# Patient Record
Sex: Female | Born: 1975 | Race: White | Hispanic: No | Marital: Single | Smoking: Current every day smoker
Health system: Southern US, Community
[De-identification: ages and names within clinical notes are randomized; demographics above are authoritative.]

## PROBLEM LIST (undated history)

## (undated) DIAGNOSIS — K589 Irritable bowel syndrome without diarrhea: Secondary | ICD-10-CM

## (undated) DIAGNOSIS — N3010 Interstitial cystitis (chronic) without hematuria: Secondary | ICD-10-CM

## (undated) DIAGNOSIS — K219 Gastro-esophageal reflux disease without esophagitis: Secondary | ICD-10-CM

## (undated) DIAGNOSIS — Z789 Other specified health status: Secondary | ICD-10-CM

## (undated) DIAGNOSIS — G43909 Migraine, unspecified, not intractable, without status migrainosus: Secondary | ICD-10-CM

## (undated) DIAGNOSIS — M797 Fibromyalgia: Secondary | ICD-10-CM

## (undated) DIAGNOSIS — G5601 Carpal tunnel syndrome, right upper limb: Secondary | ICD-10-CM

## (undated) DIAGNOSIS — E039 Hypothyroidism, unspecified: Secondary | ICD-10-CM

## (undated) HISTORY — DX: Interstitial cystitis (chronic) without hematuria: N30.10

## (undated) HISTORY — DX: Hypothyroidism, unspecified: E03.9

## (undated) HISTORY — PX: CHOLECYSTECTOMY: SHX55

## (undated) HISTORY — DX: Fibromyalgia: M79.7

---

## 2001-10-24 ENCOUNTER — Observation Stay (HOSPITAL_COMMUNITY): Admission: RE | Admit: 2001-10-24 | Discharge: 2001-10-25 | Payer: Self-pay | Admitting: Obstetrics & Gynecology

## 2001-10-24 HISTORY — PX: SALPINGOOPHORECTOMY: SHX82

## 2001-10-24 HISTORY — PX: VAGINAL HYSTERECTOMY: SUR661

## 2002-12-05 ENCOUNTER — Ambulatory Visit (HOSPITAL_COMMUNITY): Admission: RE | Admit: 2002-12-05 | Discharge: 2002-12-05 | Payer: Self-pay | Admitting: Internal Medicine

## 2003-01-22 ENCOUNTER — Ambulatory Visit (HOSPITAL_COMMUNITY): Admission: RE | Admit: 2003-01-22 | Discharge: 2003-01-22 | Payer: Self-pay | Admitting: Obstetrics & Gynecology

## 2003-01-22 HISTORY — PX: LAPAROSCOPIC SALPINGOOPHERECTOMY: SUR795

## 2003-11-03 ENCOUNTER — Emergency Department (HOSPITAL_COMMUNITY): Admission: EM | Admit: 2003-11-03 | Discharge: 2003-11-04 | Payer: Self-pay | Admitting: *Deleted

## 2008-01-20 ENCOUNTER — Emergency Department (HOSPITAL_COMMUNITY): Admission: EM | Admit: 2008-01-20 | Discharge: 2008-01-20 | Payer: Self-pay | Admitting: Emergency Medicine

## 2010-01-04 ENCOUNTER — Ambulatory Visit: Payer: Self-pay | Admitting: Internal Medicine

## 2010-01-11 DIAGNOSIS — K219 Gastro-esophageal reflux disease without esophagitis: Secondary | ICD-10-CM

## 2010-01-11 DIAGNOSIS — R1319 Other dysphagia: Secondary | ICD-10-CM

## 2010-01-13 HISTORY — PX: ESOPHAGOGASTRODUODENOSCOPY: SHX1529

## 2010-01-18 ENCOUNTER — Ambulatory Visit (HOSPITAL_COMMUNITY)
Admission: RE | Admit: 2010-01-18 | Discharge: 2010-01-18 | Payer: Self-pay | Source: Home / Self Care | Admitting: Internal Medicine

## 2010-01-20 ENCOUNTER — Encounter: Payer: Self-pay | Admitting: Internal Medicine

## 2010-01-21 ENCOUNTER — Telehealth (INDEPENDENT_AMBULATORY_CARE_PROVIDER_SITE_OTHER): Payer: Self-pay

## 2010-01-21 ENCOUNTER — Emergency Department (HOSPITAL_COMMUNITY)
Admission: EM | Admit: 2010-01-21 | Discharge: 2010-01-21 | Payer: Self-pay | Source: Home / Self Care | Admitting: Emergency Medicine

## 2010-01-21 ENCOUNTER — Encounter (INDEPENDENT_AMBULATORY_CARE_PROVIDER_SITE_OTHER): Payer: Self-pay

## 2010-01-25 ENCOUNTER — Ambulatory Visit (HOSPITAL_COMMUNITY)
Admission: RE | Admit: 2010-01-25 | Discharge: 2010-01-25 | Payer: Self-pay | Source: Home / Self Care | Attending: Internal Medicine | Admitting: Internal Medicine

## 2010-01-27 ENCOUNTER — Ambulatory Visit
Admission: RE | Admit: 2010-01-27 | Discharge: 2010-01-27 | Payer: Self-pay | Source: Home / Self Care | Attending: Urology | Admitting: Urology

## 2010-01-27 HISTORY — PX: CYSTOSCOPY: SUR368

## 2010-01-31 ENCOUNTER — Telehealth (INDEPENDENT_AMBULATORY_CARE_PROVIDER_SITE_OTHER): Payer: Self-pay

## 2010-03-11 ENCOUNTER — Encounter: Payer: Self-pay | Admitting: Urgent Care

## 2010-03-15 NOTE — Progress Notes (Signed)
Summary: abd pain, SOB  Phone Note Call from Patient Call back at Home Phone [phone]   Caller: Patient Summary of Call: pt called she is having increasing abd pain, pressure and swelling. pt also c/o SOB. paged RMR. please advise Initial call taken by: Hendricks Limes LPN,  January 21, 2010 9:01 AM     Appended Document: abd pain, SOB to the ED  Appended Document: abd pain, SOB pt aware

## 2010-03-15 NOTE — Miscellaneous (Signed)
Summary: labs from [facility]  Clinical Lists Changes L-Lipase, Blood - STATUS: Final                                            Perform Date: 6 Dec11 10:47  Ordered By: Jena Gauss MD , Gerrit Friends           Ordered Date: 6 Dec11 10:47                                       Last Updated Date: 6 Dec11 11:58  Facility: [facility]                               Department: GENL  Accession #: [id number]                  USN:       [id number]  Findings  Result Name                              Result     Abnl   Normal Range     Units      Perf. Loc.  Lipase                                           31                11-59            U/L  Additional Information  HL7 RESULT STATUS : F  External IF Update Timestamp : 2010-01-18:11:56:00.000000     L-Hepatic Function Panel (HFP / LFT) - STATUS: Final                                            Perform Date: 6 Dec11 10:47  Ordered By: Jena Gauss MD , Gerrit Friends           Ordered Date: 6 Dec11 10:47                                       Last Updated Date: 6 Dec11 11:58  Facility: [facility]                               Department: GENL  Accession #: [id number]                     USN:       [id number]  Findings  Result Name                              Result     Abnl   Normal Range     Units      Perf. Loc.  Bilirubin, Total  0.2        l      0.3-1.2          mg/dL  Bilirubin, Direct                             <0.1              0.0-0.3          mg/dL  Indirect Bilirubin                       SEE NOTE.         0.3-0.9          mg/dL    NOT CALCULATED  Alkaline Phosphatase                        68                39-117           U/L  SGOT (AST)                                   19                0-37             U/L  SGPT (ALT)                                     11                0-35             U/L  Total  Protein                                  6.5               6.0-8.3          g/dL  Albumin-Blood                                 4.1               3.5-5.2          g/dL  Additional Information  HL7 RESULT STATUS : F  External IF Update Timestamp : 2010-01-18:11:56:00.000000

## 2010-03-15 NOTE — Letter (Addendum)
Summary: Patient Notice, Endo Biopsy Results  [facility]  [address]   [place], [place] [address]   Phone: [phone]  Fax: [phone]       January 20, 2010   [place] [address] [place], [place]  [address] 1975/05/01    Dear Ms. Habermehl,  I am pleased to inform you that the biopsies taken during your recent endoscopic examination did not show any evidence of cancer upon pathologic examination. There was only mild inflammation in your stomach.  Additional information/recommendations:  Continue with the treatment plan as outlined on the day of your exam.   Please call us if you are having persistent problems or have questions about your condition that have not been fully answered at this time.  Sincerely,    R. Roetta Sessions MD, FACP [facility] Ph: [phone]   Fax: [phone]   Appended Document: Patient Notice, Endo Biopsy Results letter mailed to pt

## 2010-03-17 NOTE — Assessment & Plan Note (Signed)
Summary: ABD PAIN/SS   Visit Type:  Initial Consult Primary Care Tanya Gonzalez:  shah  Chief Complaint:  abd pain.  History of Present Illness: 35 year old lady referred by Dr. Sherryll Gonzalez for  further evaluation of a 2  year history of insidiously worsening boring epigastric pain with  some worsening of the typical reflux symptoms. Some early satiety. No nausea or vomiting. Occasional constipation alternating with diarrhea but no melena or hematochezia she is not taking any nonsteroidal agents. Her gallbladder was removed about 5 years ago( biliary dyskinesia by patient history).  no dyphagia.  I saw this lady in 2004. For dyspeptic symptoms and change in bowel habits. She underwent colonoscopy which revealed only internal hemorrhoids; EGD was normal at that time. She denies alcohol or illicit drug use. She has been having some urinary tract symptoms - she is seeing a urologist in the near future.  Preventive Screening-Counseling & Management  Alcohol-Tobacco     Smoking Status: current  Current Medications (verified): 1)  Synthroid 125 Mcg Tabs (Levothyroxine Sodium) .... Once Daily  Allergies (verified): 1)  ! Codeine  Past History:  Past Medical History: thyroid  Past Surgical History: hyst exploratory surgery gallbladder  Family History: Father: alive- agent orange exposure Mother: alive- high cholesterol Siblings: 1 brother No FH of Colon Cancer:  Social History: Marital Status: Married Children: 4 Occupation: no Patient currently smokes.  Alcohol Use - yes- occ Smoking Status:  current  Vital Signs:  Patient profile:   35 year old female Height:      62 inches Weight:      121 pounds BMI:     22.21 Temp:     98.6 degrees F oral Pulse rate:   72 / minute BP sitting:   112 / 72  (left arm) Cuff size:   regular  Vitals Entered By: Tanya Gonzalez (January 04, 2010 3:25 PM)  Physical Exam  General:  alert conversant lady in no acute distress Lungs:  clear to  auscultation Heart:  regular rate and rhythm without murmur gallop rub Abdomen:  nondistended positive bowel sounds tenderness localized to the epigastrium without appreciable mass or organomegaly  Impression & Recommendations: Impression: 35 year old lady with a two-year history of insidiously worsening epigastric pain with some component of reflux symptoms. Gallbladder is out;  she noticed worsening of symptoms with consumption orf white bread and milk (not mentioned above). I agree with Dr. Sherryll Gonzalez -  further investigation is warranted. Differential at  this time somewhat broad including peptic ulcer disease, nonulcer dyspepsia, H. pylori infection or poorly controlled reflux;  occult biliary pathology also remians in the differential.    Recommendations: Diagnostic EGD in the near future. Risks, benefits, limitations, alternatives and imponderables have been reviewed her questions answered. All parties agreeable - further recommendations once endoscopic evaluation performed.Marland Kitchen  Appended Document: Orders Update    Clinical Lists Changes  Problems: Added new problem of OTHER DYSPHAGIA (ICD-787.29) Added new problem of GERD (ICD-530.81) Orders: Added new Service order of Consultation Level IV [phone]) - Signed      Appended Document: ABD PAIN/SS u/s ok; just mild inflammation in stomach; Try 3 mo course of protonix 40 mg daily Rx 30 w 2 refills ; offer f/u appt 4 /12  Appended Document: ABD PAIN/SS Pt informed. Rx called to [facility] in [place] to [place].   Appended Document: ABD PAIN/SS reminder in computer

## 2010-03-17 NOTE — Medication Information (Signed)
Summary: PA for protonix  PA for protonix   Imported By: Hendricks Limes LPN 04/54/0981 19:14:78  _____________________________________________________________________  External Attachment:    Type:   Image     Comment:   External Document

## 2010-03-17 NOTE — Progress Notes (Signed)
Summary: phenergan request  Phone Note Call from Patient Call back at Home Phone [phone]   Caller: Patient Summary of Call: pt called- she has been unable to get her protonix d/t it was called in to the wrong pharmacy and she will need a PA. Pt wants to know if she can get an rx for phenergan sent to [facility]/ [place] Initial call taken by: Hendricks Limes LPN,  January 31, 2010 12:46 PM     Appended Document: phenergan request    Prescriptions: PROMETHAZINE HCL 25 MG TABS (PROMETHAZINE HCL) 1 by mouth q 8 hrs as needed n/v  #20 x 0   Entered and Authorized by:   Joselyn Arrow FNP-BC   Signed by:   Joselyn Arrow FNP-BC on 01/31/2010   Method used:   Electronically to        [facility] # [phone]* (retail)       [address]       [place], [place]  [address]       Ph: [phone] or [phone]       Fax: [phone]   RxID:   [id number]

## 2010-03-18 ENCOUNTER — Ambulatory Visit: Payer: Medicaid Other | Attending: Urology | Admitting: Physical Therapy

## 2010-03-18 DIAGNOSIS — M2569 Stiffness of other specified joint, not elsewhere classified: Secondary | ICD-10-CM | POA: Insufficient documentation

## 2010-03-18 DIAGNOSIS — IMO0001 Reserved for inherently not codable concepts without codable children: Secondary | ICD-10-CM | POA: Insufficient documentation

## 2010-03-18 DIAGNOSIS — M2420 Disorder of ligament, unspecified site: Secondary | ICD-10-CM | POA: Insufficient documentation

## 2010-03-18 DIAGNOSIS — M629 Disorder of muscle, unspecified: Secondary | ICD-10-CM | POA: Insufficient documentation

## 2010-03-18 DIAGNOSIS — R5381 Other malaise: Secondary | ICD-10-CM | POA: Insufficient documentation

## 2010-03-18 NOTE — Letter (Signed)
Summary: EGD ORDER  EGD ORDER   Imported By: Ave Filter 01/04/2010 16:04:13  _____________________________________________________________________  External Attachment:    Type:   Image     Comment:   External Document

## 2010-03-22 ENCOUNTER — Ambulatory Visit: Payer: Medicaid Other | Admitting: Physical Therapy

## 2010-04-12 ENCOUNTER — Ambulatory Visit: Payer: Medicaid Other | Admitting: Physical Therapy

## 2010-04-25 LAB — URINALYSIS, ROUTINE W REFLEX MICROSCOPIC
Glucose, UA: NEGATIVE mg/dL
Nitrite: NEGATIVE
Specific Gravity, Urine: 1.01 (ref 1.005–1.030)
pH: 7 (ref 5.0–8.0)

## 2010-04-25 LAB — HEPATIC FUNCTION PANEL
AST: 19 U/L (ref 0–37)
Albumin: 4.1 g/dL (ref 3.5–5.2)
Total Bilirubin: 0.2 mg/dL — ABNORMAL LOW (ref 0.3–1.2)
Total Protein: 6.5 g/dL (ref 6.0–8.3)

## 2010-04-25 LAB — COMPREHENSIVE METABOLIC PANEL
ALT: 13 U/L (ref 0–35)
Albumin: 4.4 g/dL (ref 3.5–5.2)
Alkaline Phosphatase: 69 U/L (ref 39–117)
Calcium: 9.3 mg/dL (ref 8.4–10.5)
Potassium: 3.8 mEq/L (ref 3.5–5.1)
Sodium: 140 mEq/L (ref 135–145)
Total Protein: 7.2 g/dL (ref 6.0–8.3)

## 2010-04-25 LAB — URINE MICROSCOPIC-ADD ON

## 2010-04-25 LAB — POCT HEMOGLOBIN-HEMACUE: Hemoglobin: 15.5 g/dL — ABNORMAL HIGH (ref 12.0–15.0)

## 2010-04-25 LAB — DIFFERENTIAL
Basophils Relative: 1 % (ref 0–1)
Lymphs Abs: 2.4 10*3/uL (ref 0.7–4.0)
Monocytes Absolute: 0.5 10*3/uL (ref 0.1–1.0)
Monocytes Relative: 6 % (ref 3–12)
Neutro Abs: 4.9 10*3/uL (ref 1.7–7.7)
Neutrophils Relative %: 60 % (ref 43–77)

## 2010-04-25 LAB — CBC
MCHC: 35.1 g/dL (ref 30.0–36.0)
Platelets: 183 10*3/uL (ref 150–400)
RDW: 13.4 % (ref 11.5–15.5)
WBC: 8.2 10*3/uL (ref 4.0–10.5)

## 2010-04-25 LAB — LIPASE, BLOOD: Lipase: 31 U/L (ref 11–59)

## 2010-05-03 ENCOUNTER — Encounter: Payer: Medicaid Other | Admitting: Physical Therapy

## 2010-06-08 ENCOUNTER — Ambulatory Visit: Payer: Medicaid Other | Admitting: Gastroenterology

## 2010-06-20 ENCOUNTER — Encounter: Payer: Self-pay | Admitting: Gastroenterology

## 2010-06-20 ENCOUNTER — Ambulatory Visit (INDEPENDENT_AMBULATORY_CARE_PROVIDER_SITE_OTHER): Payer: Medicaid Other | Admitting: Gastroenterology

## 2010-06-20 VITALS — BP 118/81 | HR 83 | Temp 97.6°F | Ht 62.0 in | Wt 124.6 lb

## 2010-06-20 DIAGNOSIS — R1013 Epigastric pain: Secondary | ICD-10-CM

## 2010-06-20 DIAGNOSIS — K589 Irritable bowel syndrome without diarrhea: Secondary | ICD-10-CM

## 2010-06-20 DIAGNOSIS — R110 Nausea: Secondary | ICD-10-CM

## 2010-06-20 DIAGNOSIS — K219 Gastro-esophageal reflux disease without esophagitis: Secondary | ICD-10-CM

## 2010-06-20 MED ORDER — PROMETHAZINE HCL 25 MG PO TABS: 25.0000 mg | ORAL_TABLET | Freq: Four times a day (QID) | ORAL | Status: DC | PRN

## 2010-06-20 MED ORDER — LUBIPROSTONE 24 MCG PO CAPS: 24.0000 ug | ORAL_CAPSULE | Freq: Two times a day (BID) | ORAL | Status: AC

## 2010-06-20 NOTE — Progress Notes (Signed)
      Primary Care Physician: Kirstie Peri, MD  Primary Gastroenterologist:  Dr. Roetta Sessions  Chief Complaint  Patient presents with  . Follow-up    med refill    HPI: Tanya Gonzalez is a 35 y.o. female here for f/u chhronic nausea and heartburn, on chronic phenergan. No longer having heartburn, only occasional flare-up. Protonix helps. Previously tried Prilosec, Prevacid, Nexium, Dexilant without relief. No dysphagia. No weight loss. BM always hard. Rectum hurts all the time. Occasion brbpr with straining. No abdominal pain.   Current Outpatient Prescriptions  Medication Sig Dispense Refill  . diazepam (DIASTAT) 2.5 MG GEL Place 2.5 mg vaginally at bedtime.        Marland Kitchen HYDROcodone-acetaminophen (LORTAB) 7.5-500 MG per tablet Take 1 tablet by mouth every 6 (six) hours as needed.        Marland Kitchen ibuprofen (ADVIL,MOTRIN) 200 MG tablet Take 200 mg by mouth every 6 (six) hours as needed.        Marland Kitchen levothyroxine (SYNTHROID, LEVOTHROID) 125 MCG tablet Take 125 mcg by mouth daily.        . pantoprazole (PROTONIX) 40 MG tablet Take 40 mg by mouth daily.        . promethazine (PHENERGAN) 25 MG tablet Take 1 tablet (25 mg total) by mouth every 6 (six) hours as needed.  20 tablet  0  . lubiprostone (AMITIZA) 24 MCG capsule Take 1 capsule (24 mcg total) by mouth 2 (two) times daily with a meal.  60 capsule  0    Allergies as of 06/20/2010 - Review Complete 06/20/2010  Allergen Reaction Noted  . Codeine      ROS:  General: Negative for anorexia, weight loss, fever, chills, fatigue, weakness. ENT: Negative for hoarseness, difficulty swallowing , nasal congestion. CV: Negative for chest pain, angina, palpitations, dyspnea on exertion, peripheral edema.  Respiratory: Negative for dyspnea at rest, dyspnea on exertion, cough, sputum, wheezing.  GI: See history of present illness. GU:  Negative for dysuria, hematuria, urinary incontinence, urinary frequency, nocturnal urination.  Endo: Negative for unusual  weight change.    Physical Examination:   BP 118/81  Pulse 83  Temp(Src) 97.6 F (36.4 C) (Tympanic)  Ht 5\' 2"  (1.575 m)  Wt 124 lb 9.6 oz (56.518 kg)  BMI 22.79 kg/m2  General: Well-nourished, well-developed in no acute distress.  Eyes: No icterus. Mouth: Oropharyngeal mucosa moist and pink , no lesions erythema or exudate. Lungs: Clear to auscultation bilaterally.  Heart: Regular rate and rhythm, no murmurs rubs or gallops.  Abdomen: Bowel sounds are normal, nontender, nondistended, no hepatosplenomegaly or masses, no abdominal bruits or hernia , no rebound or guarding.   Extremities: No lower extremity edema.  Neuro: Alert and oriented x 4   Skin: Warm and dry, no jaundice.   Psych: Alert and cooperative, normal mood and affect.

## 2010-06-22 ENCOUNTER — Encounter: Payer: Self-pay | Admitting: Gastroenterology

## 2010-06-22 DIAGNOSIS — R1013 Epigastric pain: Secondary | ICD-10-CM | POA: Insufficient documentation

## 2010-06-22 DIAGNOSIS — R110 Nausea: Secondary | ICD-10-CM | POA: Insufficient documentation

## 2010-06-22 DIAGNOSIS — K589 Irritable bowel syndrome without diarrhea: Secondary | ICD-10-CM | POA: Insufficient documentation

## 2010-06-22 NOTE — Assessment & Plan Note (Addendum)
Add Amitiza 24 mcg twice a day with food #6 pills and prescription provided. Office visit in 6 months.

## 2010-06-22 NOTE — Assessment & Plan Note (Signed)
Resolved

## 2010-06-22 NOTE — Assessment & Plan Note (Addendum)
Chronic nausea in the setting of GERD and irritable bowel syndrome. She is using Phenergan 1-2 times daily. Bowel movements are always hard. Treat constipation and hopefully nausea will improve as well. Limit Phenergan use. Office visit in 6 months.

## 2010-06-22 NOTE — Assessment & Plan Note (Signed)
Symptoms well controlled on Protonix. 

## 2010-06-22 NOTE — Progress Notes (Signed)
Cc to PCP 

## 2010-07-01 NOTE — H&P (Signed)
NAME:  Tanya Gonzalez, Tanya Gonzalez NO.:  [id number]   MEDICAL RECORD NO.:  [id number]                  PATIENT TYPE:   LOCATION:                                       FACILITY:  [facility]   PHYSICIAN:  Lazaro Arms, M.D.                DATE OF BIRTH:  Mar 07, 1975   DATE OF ADMISSION:  DATE OF DISCHARGE:                                HISTORY & PHYSICAL   HISTORY OF PRESENT ILLNESS:  The patient is a 35 year old gravida 5, para 4,  abortus 1, and one set of twins, who is referred from Dr. Clelia Croft because of  midline pelvic pain all the time and also pain radiating from the left.  She  has a feeling that she is always in her period.  She has pain that shoots  down her vagina and her left leg.  She has pain with intercourse and feels  swollen and bloated.  Her periods are average to heavy with severe cramping  and clotting with each one of her periods.  She had a tubal in 2001.  On  examination, her uterus is anteverted, tenderness to palpation.  The  uterosacral ligaments are tender to cervical motion.  The left adnexa is  also quite tender but normal size.  The exam is consistent with adenomyosis  and possible endometriosis.  Her right ovary is normal size and nontender.  As a result, she is admitted for TVH and LSO.   PAST MEDICAL HISTORY:  1. Human papilloma virus.  2. Hypothyroidism.   PAST SURGICAL HISTORY:  Tubal ligation in 2001.   PAST OBSTETRICAL HISTORY:  All vaginal deliveries.   ALLERGIES:  CODEINE.   MEDICATIONS:  Synthroid 0.125 mg a day.   REVIEW OF SYSTEMS:  Otherwise negative.   PHYSICAL EXAMINATION:  VITAL SIGNS:  Blood pressure is 120/80.  Weight is  126-1/4 pounds.  HEENT:  Unremarkable.  NECK:  Thyroid is normal.  LUNGS:  Clear.  HEART:  Regular rate and rhythm without murmurs, rubs, or gallops.  BREASTS:  Without mass, discharge or skin changes.  ABDOMEN:  Benign.  PELVIC:  Exam as above.  EXTREMITIES:  Warm with no edema.  NEUROLOGIC:  Grossly intact.   IMPRESSION:  1. Hypermenorrhea.  2. Dysmenorrhea.  3. Dyspareunia.  4. Midline chronic pelvic pain.  5. Left lower quadrant pain.  6. Probable adenomyosis.   PLAN:  The patient and I talked over the options extensively.  She has  completed childbearing.  Her pain and periods are significantly disturbing  to her life and as a result, she is admitted for a TVH/LSO.  She understands  the risks, benefits, indications and alternatives and will proceed.  Lazaro Arms, M.D.    Loraine Maple  D:  10/23/2001  T:  10/24/2001  Job:  [id number]

## 2010-07-01 NOTE — Op Note (Signed)
NAME:  Tanya Gonzalez, WASKEY                           ACCOUNT NO.:  [id number]   MEDICAL RECORD NO.:  [id number]                   PATIENT TYPE:  AMB   LOCATION:  DAY                                  FACILITY:  [facility]   PHYSICIAN:  Lazaro Arms, M.D.                DATE OF BIRTH:  [birth year]   DATE OF PROCEDURE:  01/22/2003  DATE OF DISCHARGE:                                 OPERATIVE REPORT   PREOPERATIVE DIAGNOSES:  1. Dyspareunia.  2. Right lower quadrant pain.   POSTOPERATIVE DIAGNOSES:  1. Dyspareunia.  2. Right lower quadrant pain.   PROCEDURE:  Laparoscopic right salpingo-oophorectomy.   SURGEON:  Lazaro Arms, M.D.   ANESTHESIA:  General endotracheal.   FINDINGS:  The patient had just a small amount of adhesions over on the left  side, some epiploica of the sigmoid to the pelvic side wall.  These were  taken down.  The right ovary had a small cyst on it but otherwise appeared  normal.  There were no adhesions to the vagina. The appendix was normal,  liver, gallbladder and all the intraperitoneal cavity was normal.   DESCRIPTION OF OPERATION:  The patient was taken to the operating room and  placed in the supine position where she underwent general endotracheal  anesthesia; placed in dorsal lithotomy position; and prepped and draped in  the usual sterile fashion.   Incision was made in the umbilicus and an 11/12 trocar was placed in the  peritoneal cavity with 1 pass without difficulty.  The peritoneal cavity was  insufflated and confirmed with a videolaparoscope.  Two 5-mm trocars were  placed 2 fingerbreadths above the pubis in the midline in the right lower  quadrant.  Both under direct visualization without difficulty.  The right  ovary was grasped.  The harmonic scalpel was used and the infundibulopelvic  ligament was taken down without any blood loss. It was then removed from the  pelvic side wall stretching it out from the side wall and the peritoneum was   incised.   Adhesions were taken down with the harmonic scalpel as well.  The appendix  was normal.  All other peritoneal cavity structures were normal.  The  instruments were removed.  Gas was allowed to escape.  Fascia was closed  using #0 Vicryl.  All incisions were closed with 3-0 Vicryl and Dermabond.  Marcaine 1/2% was injected for postop pain relief.   The patient tolerated the procedure well.  She experienced no blood loss and  was taken to recovery room in good stable condition.  All counts were  correct.      ___________________________________________                                            Amaryllis Dyke.  Despina Hidden, M.D.   Loraine Maple  D:  01/22/2003  T:  01/22/2003  Job:  [id number]

## 2010-07-01 NOTE — Op Note (Signed)
NAME:  Tanya Gonzalez, Tanya Gonzalez                           ACCOUNT NO.:  [id number]   MEDICAL RECORD NO.:  [id number]                   PATIENT TYPE:  AMB   LOCATION:  DAY                                  FACILITY:  [facility]   PHYSICIAN:  Lazaro Arms, M.D.                DATE OF BIRTH:  [birth year]   DATE OF PROCEDURE:  10/24/2001  DATE OF DISCHARGE:                                 OPERATIVE REPORT   PREOPERATIVE DIAGNOSES:  1. Hypermenorrhea.  2. Dysmenorrhea.  3. Dyspareunia.  4. chronic midline pelvic pain.  5. Left lower quadrant pain.  6. Probable adenomyosis.   POSTOPERATIVE DIAGNOSES:  1. Hypermenorrhea.  2. Dysmenorrhea.  3. Dyspareunia.  4. chronic midline pelvic pain.  5. Left lower quadrant pain.  6. Probable adenomyosis.   PROCEDURE:  Vaginal hysterectomy with left salpingo-oophorectomy.   SURGEON:  Lazaro Arms, M.D.   ANESTHESIA:  General endotracheal.   FINDINGS:  The patient had a corpus luteum cyst of the left ovary. She did  not have any endometriosis seen. She had a big boggy uterus probably  consistent with adenomyosis, no definitive myoma seen. The right ovary and  tube were normal.   DESCRIPTION OF PROCEDURE:  The patient was taken to the operating room,  placed in a supine position where she underwent general endotracheal  anesthesia. She was then placed in the dorsal lithotomy position, prepped  and draped in the usual sterile fashion. A Foley catheter was placed for  continuous bladder drainage, a weighted speculum was placed in the vagina.  The cervix was grasped with thyroid tenaculums, 0.5% Marcaine with 1:200,000  epinephrine was injected in a circumferential fashion about the cervix and  then the electrocautery unit was used and a circumferential incision made.  The anterior and posterior vagina were pushed off the lower uterine segment  respectively, the posterior cul-de-sac was entered sharply. The uterosacral  ligaments were clamped, cut,  transfixion suture ligated and held  bilaterally. The cardinal ligaments were clamped, cut, transfixion suture  ligated and cut bilaterally. The anterior cul-de-sac was entered sharply  without difficulty. The anterior and posterior leaves of the broad ligament  were plicated and the uterine vessels were clamped, cut and suture ligated.  Serial pedicles were taken down to the fundus. Morcellation was performed in  order to deliver the specimen completely. The infundibulopelvic ligament on  the left was cross clamped from above and below. Multiple hemoclips were  placed to ensure peritoneum not retracting. Suture ligation was performed of  this large pedicle and the left half of the specimen was removed with the  tube and ovary. The uteroovarian ligament on the right was cross clamped  from above and below again using hemoclips cephalad to prevent the  peritoneum from sliding back and causing bleeding. Several interrupted  sutures were placed for hemostasis as well and the right portion of the  uterus  was removed. The right ovary remained in place. All pedicles were  found to be hemostatic. The peritoneum was closed in a pursestring fashion.  The vagina was closed from side to side anteriorly to posteriorly with  interrupted sutures with good hemostasis. The vagina was irrigated, the  [place] culdoplasty suture was tied down and the patient tolerated the  procedure well. She experienced 225 cc of blood loss and was taken to the  recovery room in good stable condition. All counts correct x3. She received  Levaquin prophylactically.                                                Lazaro Arms, M.D.    Loraine Maple  D:  10/24/2001  T:  10/25/2001  Job:  [id number]

## 2010-07-01 NOTE — Op Note (Signed)
NAME:  Tanya Gonzalez, Tanya Gonzalez                           ACCOUNT NO.:  [id number]   MEDICAL RECORD NO.:  [id number]                   PATIENT TYPE:  AMB   LOCATION:  DAY                                  FACILITY:  [facility]   PHYSICIAN:  R. Roetta Sessions, M.D.              DATE OF BIRTH:  August 15, 1975   DATE OF PROCEDURE:  12/05/2002  DATE OF DISCHARGE:                                 OPERATIVE REPORT   PROCEDURE:  Diagnostic esophagogastroduodenoscopy, colonoscopy with  ileoscopy.   INDICATIONS FOR PROCEDURE:  The patient is a 35 year old lady with  alternating constipation and diarrhea, most consistent with irritable bowel  syndrome.  Has had some boring epigastric pain recently in the setting of BC  Powder use and also has had some blood per rectum.  EGD and colonoscopy are  now being done to further evaluate her symptoms.  This approach has been  discussed with the patient at length previously and again today at the  bedside.  The potential risks, benefits, and alternatives have been  reviewed.   PROCEDURE:  O2 saturation, blood pressure, pulses, and respirations were  monitored throughout the entire procedure.  Conscious sedation was with  Versed 4 mg IV, Demerol 100 mg IV in divided doses.  The instrument used was  the Olympus video chip adult gastroscope and adult colonoscope.   EGD FINDINGS:  Examination of the tubular esophagus revealed no mucosal  abnormalities.  The EG junction was easily traversed.   Stomach:  The gastric cavity was empty and insufflated well with air.  Thorough examination of the gastric mucosa including retroflex view in the  proximal stomach and esophagogastric junction demonstrated no abnormalities.  The pylorus was patent and easily traversed.   Duodenum:  Examination of the bulb and second portion revealed no  abnormalities.   THERAPEUTIC/DIAGNOSTIC MANEUVERS PERFORMED:  None.   The patient tolerated the procedure well and was prepared for colonoscopy.   COLONOSCOPY FINDINGS:  Digital rectal examination revealed no abnormalities.   ENDOSCOPIC FINDINGS:  The prep was marginal.   Rectum:  Examination of the rectal mucosa including retroflex view of the  anal verge revealed no abnormalities, aside from internal hemorrhoids.   Colon:  The colonic mucosa was surveyed from the rectosigmoid junction  through the left, transverse, right colon to the area of the appendiceal  orifice, ileocecal valve, and cecum.  There was some stool along the way  that had to be washed, but the mucosa was felt to be seen well, and there  were no abnormalities.  The terminal ileum was intubated to 10 cm.  This  segment of the GI tract also appeared normal.  From the level of the cecum  and ileocecal valve, the scope was slowly withdrawn.  All previously  mentioned mucosal surfaces were again seen, and no abnormalities were  observed.  The patient tolerated both of the procedures well and was  reactive in endoscopy.   EGD IMPRESSION:  Normal esophagus, stomach, and first and second portions of  the duodenum.   COLONOSCOPY IMPRESSION:  Internal hemorrhoids.  Otherwise, normal rectum,  colon, and terminal ileum.   RECOMMENDATIONS:  1. Hemorrhoid literature provided to Tanya Gonzalez.  2. Anusol-HC suppositories one per rectum at bedtime for 10 days.  3. Avoid aspirin-containing medications.  4. Continue Prevacid 30 mg orally daily.  5. Continue Zelnorm.  6. I feel it would be in Tanya Gonzalez's best interest to see Dr. Despina Hidden for a GYN     evaluation to make sure everything is okay from a GYN perspective.  7. NuLev one tablet on the tongue before meals and a bedtime p.r.n.     abdominal discomfort.  8. I will plan to see this nice lady back in one month.      ___________________________________________                                            Jonathon Bellows, M.D.   RMR/MEDQ  D:  12/05/2002  T:  12/05/2002  Job:  [id number]   cc:   Dr. Janene [facility]   Lazaro Arms, M.D.  [address]  [place]  [place] [address]  Fax: [phone]

## 2010-09-29 ENCOUNTER — Other Ambulatory Visit: Payer: Self-pay | Admitting: Gastroenterology

## 2010-11-15 ENCOUNTER — Encounter: Payer: Self-pay | Admitting: Internal Medicine

## 2011-10-19 ENCOUNTER — Encounter (HOSPITAL_COMMUNITY): Payer: Self-pay | Admitting: Emergency Medicine

## 2011-10-19 ENCOUNTER — Emergency Department (HOSPITAL_COMMUNITY)
Admission: EM | Admit: 2011-10-19 | Discharge: 2011-10-19 | Disposition: A | Discharge status: Home / Self Care | Payer: Medicaid Other | Attending: Emergency Medicine | Admitting: Emergency Medicine

## 2011-10-19 ENCOUNTER — Telehealth: Payer: Self-pay | Admitting: *Deleted

## 2011-10-19 ENCOUNTER — Other Ambulatory Visit: Payer: Self-pay

## 2011-10-19 DIAGNOSIS — K625 Hemorrhage of anus and rectum: Secondary | ICD-10-CM

## 2011-10-19 DIAGNOSIS — R109 Unspecified abdominal pain: Secondary | ICD-10-CM | POA: Insufficient documentation

## 2011-10-19 DIAGNOSIS — K921 Melena: Secondary | ICD-10-CM | POA: Insufficient documentation

## 2011-10-19 HISTORY — DX: Irritable bowel syndrome, unspecified: K58.9

## 2011-10-19 LAB — CBC WITH DIFFERENTIAL/PLATELET
Eosinophils Absolute: 0.2 10*3/uL (ref 0.0–0.7)
Eosinophils Relative: 3 % (ref 0–5)
HCT: 40.6 % (ref 36.0–46.0)
Lymphocytes Relative: 33 % (ref 12–46)
Lymphs Abs: 2.2 10*3/uL (ref 0.7–4.0)
MCH: 32.8 pg (ref 26.0–34.0)
MCV: 95.8 fL (ref 78.0–100.0)
Monocytes Absolute: 0.4 10*3/uL (ref 0.1–1.0)
Monocytes Relative: 5 % (ref 3–12)
Platelets: 231 10*3/uL (ref 150–400)
RBC: 4.24 MIL/uL (ref 3.87–5.11)
WBC: 6.8 10*3/uL (ref 4.0–10.5)

## 2011-10-19 MED ORDER — HYDROCORTISONE ACETATE 25 MG RE SUPP: 25.0000 mg | Freq: Two times a day (BID) | RECTAL | Status: DC

## 2011-10-19 NOTE — ED Notes (Signed)
Called pt to triage to explain delay.  No answer from pt.  People in waiting room reports pt left.  Did not notify staff.

## 2011-10-19 NOTE — Telephone Encounter (Signed)
Ms Maiorino called today. She has blood in her stool and made an appt to come in on Monday but wants to be sure she shouldn't be seen sooner. Please advise. Thanks.

## 2011-10-19 NOTE — Telephone Encounter (Signed)
Pt is aware of the plan. She has done her blood work. She knows to go to the ER if the bleeding get worst or dizzy.

## 2011-10-19 NOTE — Telephone Encounter (Signed)
Hx of internal hemorrhoids.  Likely exacerbated by constipation.  High fiber diet, drink at least 8 full glasses of water a day. Continue Amitiza. Miralax prn if needed for constipation I will send Anusol suppositories to go ahead and start. CBC stat. To ED if dizzy, abdominal pain, worsening of bleeding. Avoid straining.

## 2011-10-19 NOTE — ED Notes (Signed)
Pt c/o r side/abd pain x 2 months but now getting worse. No bm x 2 days and states saw blood in it. Pt has IC and denies any new gu sx's. nad

## 2011-10-19 NOTE — Telephone Encounter (Signed)
Called Pt to get more information. She has had two BM with BRBPR. She is having a lot of pressure in her bottom. She is also very constipated. She has been at the ER for the pass 2-3 hours. Please  advise

## 2011-10-23 ENCOUNTER — Ambulatory Visit (INDEPENDENT_AMBULATORY_CARE_PROVIDER_SITE_OTHER): Payer: Medicaid Other | Admitting: Gastroenterology

## 2011-10-23 ENCOUNTER — Encounter: Payer: Self-pay | Admitting: Gastroenterology

## 2011-10-23 VITALS — BP 98/67 | HR 77 | Temp 97.2°F | Ht 62.0 in | Wt 119.2 lb

## 2011-10-23 DIAGNOSIS — R1031 Right lower quadrant pain: Secondary | ICD-10-CM

## 2011-10-23 DIAGNOSIS — K5900 Constipation, unspecified: Secondary | ICD-10-CM

## 2011-10-23 DIAGNOSIS — K625 Hemorrhage of anus and rectum: Secondary | ICD-10-CM | POA: Insufficient documentation

## 2011-10-23 DIAGNOSIS — K649 Unspecified hemorrhoids: Secondary | ICD-10-CM

## 2011-10-23 MED ORDER — LINACLOTIDE 290 MCG PO CAPS: 1.0000 | ORAL_CAPSULE | Freq: Every day | ORAL | Status: DC

## 2011-10-23 MED ORDER — PROMETHAZINE HCL 25 MG PO TABS: 25.0000 mg | ORAL_TABLET | Freq: Four times a day (QID) | ORAL | Status: DC | PRN

## 2011-10-23 MED ORDER — PRAMOXINE-HC 1-1 % EX CREA: TOPICAL_CREAM | Freq: Three times a day (TID) | CUTANEOUS | Status: DC

## 2011-10-23 NOTE — Assessment & Plan Note (Signed)
CT A/P with constrast.

## 2011-10-23 NOTE — Assessment & Plan Note (Signed)
Likely due to anorectal source, ie hemorrhoids, in setting of constipation. Last TCS in 2004. Analpram cream RX sent to pharmacy. Increase dietary fiber and water intake. Likely offer her a colonoscopy with deep sedation (patient c/o significant discomfort with conscious sedation previously, states she was not sedated at all) after CT results available.

## 2011-10-23 NOTE — Assessment & Plan Note (Signed)
Add Linzess daily. Stop Amitiza.

## 2011-10-23 NOTE — Progress Notes (Signed)
Primary Care Physician:  SHAH,ASHISH, MD  Primary Gastroenterologist:    Chief Complaint  Patient presents with  . Rectal Bleeding  . Bloated  . Abdominal Pain    right lower side  . Constipation    HPI:  Tanya Gonzalez is a 35 y.o. female here for further evaluation of abdominal pain and rectal bleeding. She was last seen in 06/2010. She has h/o chronic nausea, heartburn, IBS. Patient has been on amitiza for one year. She called 10/19/11 with c/o pressure in her rectum, constipation, brbpr. Anusol suppositories called in but patient did not fill as insurance would not cover. She was advised to add high fiber diet/Miralax. She had CBC done. Results are below. Reportedly went to ED last week for same but waited four hours and had not been seen so she left.  Taking Amitiza two in am and two in pm. Does not always take regular. But for last two months has taken at least three days weekly. For past 6 weeks, pressure in RLQ pain. Constant discomfort but sometimes worse (stabbing). Nausea a lot. Does not use Phenergan daily. Started with rectal bleeding with hard stool. Big hemorrhoids. Hurts to sit down a lot of the time. Lots of pressure in lower abdomen. No heartburn or indigestion on pantoprazole. Interstitial cystitis stable per patient. May go one week without BM. Lot of straining involved.   Current Outpatient Prescriptions  Medication Sig Dispense Refill  . diazepam (DIASTAT) 2.5 MG GEL Place 2.5 mg vaginally at bedtime.        . HYDROcodone-acetaminophen (LORTAB) 7.5-500 MG per tablet Take 1 tablet by mouth every 6 (six) hours as needed.        . ibuprofen (ADVIL,MOTRIN) 200 MG tablet Take 200 mg by mouth every 6 (six) hours as needed.        . levothyroxine (SYNTHROID, LEVOTHROID) 125 MCG tablet Take 125 mcg by mouth daily.        . pantoprazole (PROTONIX) 40 MG tablet Take 40 mg by mouth daily.        . promethazine (PHENERGAN) 25 MG tablet take 1 tablet by mouth every 6 hours if needed  20  tablet  0  .  amitiza 24mcg bid   11  .     0    Allergies as of 10/23/2011 - Review Complete 10/23/2011  Allergen Reaction Noted  . Codeine      Past Medical History  Diagnosis Date  . IC (interstitial cystitis)   . Fibromyalgia   . Bursitis   . Hypothyroidism   . IBS (irritable bowel syndrome)     Past Surgical History  Procedure Date  . Complete hysterectomy 2003/2004    initially vaginal hysterectomy with left SOO in 2003, then right SOO in 2004  . Cholecystectomy   . Cystoscopy 2011  . Esophagogastroduodenoscopy 01/2010    gastritis, benign bx, bx neg for Celiac  . Colonoscopy     internal hemorrhoids, normal terminal ileum. patient reports being very uncomfortable during and after TCS. Versed 4mg Demerol 100mg     Family History  Problem Relation Age of Onset  . Colon cancer Neg Hx   . Lung cancer Other     maternal great uncle  . Throat cancer Other     maternal great uncle  . Ovarian cancer Maternal Aunt   . Cervical cancer Maternal Aunt   . Crohn's disease Maternal Grandmother     History   Social History  . Marital Status: Single      Spouse Name: N/A    Number of Children: 4  . Years of Education: N/A   Occupational History  .     Social History Main Topics  . Smoking status: Current Everyday Smoker -- 0.5 packs/day    Types: Cigarettes  . Smokeless tobacco: Not on file  . Alcohol Use: Yes     once a month  . Drug Use: No  . Sexually Active: Yes    Birth Control/ Protection: Surgical   Other Topics Concern  . Not on file   Social History Narrative  . No narrative on file      ROS:  General: Negative for anorexia, weight loss, fever, chills, fatigue, weakness. Eyes: Negative for vision changes.  ENT: Negative for hoarseness, difficulty swallowing , nasal congestion. CV: Negative for chest pain, angina, palpitations, dyspnea on exertion, peripheral edema.  Respiratory: Negative for dyspnea at rest, dyspnea on exertion, cough,  sputum, wheezing.  GI: See history of present illness. GU:  Negative for dysuria, hematuria, urinary incontinence. Some urinary frequency.  MS: Negative for joint pain, low back pain.  Derm: Negative for rash or itching.  Neuro: Negative for weakness, abnormal sensation, seizure, frequent headaches, memory loss, confusion.  Psych: Negative for anxiety, depression, suicidal ideation, hallucinations.  Endo: Negative for unusual weight change.  Heme: Negative for bruising or bleeding. Allergy: Negative for rash or hives.    Physical Examination:  BP 98/67  Pulse 77  Temp 97.2 F (36.2 C) (Temporal)  Ht 5' 2" (1.575 m)  Wt 119 lb 3.2 oz (54.069 kg)  BMI 21.80 kg/m2   General: Well-nourished, well-developed in no acute distress.  Head: Normocephalic, atraumatic.   Eyes: Conjunctiva pink, no icterus. Mouth: Oropharyngeal mucosa moist and pink , no lesions erythema or exudate. Neck: Supple without thyromegaly, masses, or lymphadenopathy.  Lungs: Clear to auscultation bilaterally.  Heart: Regular rate and rhythm, no murmurs rubs or gallops.  Abdomen: Bowel sounds are normal, moderate tenderness right of midline just at and below umbilicus. nondistended, no hepatosplenomegaly or masses, no abdominal bruits or    hernia , no rebound or guarding.   Rectal: small ext hemorrhoid. DRE nontender, soft brown stool heme negative.  Extremities: No lower extremity edema. No clubbing or deformities.  Neuro: Alert and oriented x 4 , grossly normal neurologically.  Skin: Warm and dry, no rash or jaundice.   Psych: Alert and cooperative, normal mood and affect.  Labs: Lab Results  Component Value Date   WBC 6.8 10/19/2011   HGB 13.9 10/19/2011   HCT 40.6 10/19/2011   MCV 95.8 10/19/2011   PLT 231 10/19/2011     Imaging Studies: No results found.    

## 2011-10-23 NOTE — Progress Notes (Signed)
Faxed to PCP

## 2011-10-23 NOTE — Patient Instructions (Addendum)
We have scheduled you for a CT of your abdomen to further evaluate your abdominal pain. Please stop Amitiza. Start Linzess , one capsule before breakfast EVERYDAY. RX sent to your pharmacy and samples provided. Analpram cream sent to your pharmacy for your hemorrhoids. Please call if not covered on Medicaid.   Constipation Constipation is when you poop (have a bowel movement) less than 3 times a week. Sometimes the poop (stool) is small and hard. There are many different causes of constipation.  HOME CARE   Go to the bathroom when you feel the urge to go.   Drink enough fluid to keep your pee (urine) clear or pale yellow.   Eat more high-fiber foods.   Drink prune juice or eat stewed fruits in the morning.   Exercise.   Ask your doctor if you should take medicine to help you poop or take fiber supplements.  GET HELP RIGHT AWAY IF:   You see blood in your poop or in the toilet.   Your belly (abdomen) gets hard and puffy (swollen).   You keep throwing up (vomiting).   You have a lot of pain.  MAKE SURE YOU:   Understand these instructions.   Will watch your condition.   Will get help right away if you are not doing well or get worse.  Document Released: 07/19/2007 Document Revised: 01/19/2011 Document Reviewed: 01/03/2011 [facility] Patient Information 2012 [place], [place].

## 2011-10-24 ENCOUNTER — Ambulatory Visit (HOSPITAL_COMMUNITY)
Admission: RE | Admit: 2011-10-24 | Discharge: 2011-10-24 | Disposition: A | Discharge status: Home / Self Care | Payer: Medicaid Other | Source: Ambulatory Visit | Attending: Gastroenterology | Admitting: Gastroenterology

## 2011-10-24 DIAGNOSIS — R1031 Right lower quadrant pain: Secondary | ICD-10-CM

## 2011-10-24 DIAGNOSIS — K625 Hemorrhage of anus and rectum: Secondary | ICD-10-CM | POA: Insufficient documentation

## 2011-10-24 DIAGNOSIS — R1011 Right upper quadrant pain: Secondary | ICD-10-CM | POA: Insufficient documentation

## 2011-10-24 MED ORDER — IOHEXOL 300 MG/ML  SOLN
100.0000 mL | Freq: Once | INTRAMUSCULAR | Status: AC | PRN
  Administered 2011-10-24: 100 mL via INTRAVENOUS

## 2011-10-25 ENCOUNTER — Other Ambulatory Visit: Payer: Self-pay | Admitting: Internal Medicine

## 2011-10-25 ENCOUNTER — Telehealth: Payer: Self-pay | Admitting: Internal Medicine

## 2011-10-25 MED ORDER — PEG 3350-KCL-NA BICARB-NACL 420 G PO SOLR: 4000.0000 mL | ORAL | Status: AC

## 2011-10-25 NOTE — Progress Notes (Signed)
Patient is scheduled for TCS w/RMR in the OR on 9/26 and pre op is 9/20 and instructions were mailed to the patient has well as discussed over the phone

## 2011-10-25 NOTE — Telephone Encounter (Signed)
We have sent RX for anusol and now analpram. Please find out what her insurance will cover. Thanks.

## 2011-10-25 NOTE — Progress Notes (Signed)
Quick Note:  Please let pt know. CT ok. Her appendix is normal. Nothing to explain bleeding or abd pain.  She needs Colonoscopy with deep sedation in the OR with Dr. Jena Gauss, dx: rectal bleeding. Give two days of clear liquids, start dulcolax 10mg  daily two days before prep.  ______

## 2011-10-25 NOTE — Telephone Encounter (Signed)
Routing to refill box for possible alternative medication.  Just received request for PA for Linzess today. Will be working on PA for pt.

## 2011-10-25 NOTE — Telephone Encounter (Signed)
Pt called this afternoon saying that medicaid will not cover the hemorrhoid cream prescription we gave her and was there anything else we could call in.  Also, she said the other prescription needed a prior aurthorization done and was asking about it as well. Pt uses [facility] and you can reach her at [phone]

## 2011-10-27 NOTE — Telephone Encounter (Signed)
I guess she will need to buy OTC hydrocortisone hemorrhoid cream, use as directed on tube.

## 2011-10-27 NOTE — Telephone Encounter (Signed)
Did u find out what Medicaid will cover?

## 2011-10-27 NOTE — Telephone Encounter (Signed)
Called pharmacy- they do not know of any hemorrhoid creams that Medicaid is willing to pay for. I checked formulary and did not find one either.

## 2011-10-30 ENCOUNTER — Encounter (HOSPITAL_COMMUNITY): Payer: Self-pay | Admitting: Pharmacy Technician

## 2011-11-02 ENCOUNTER — Telehealth: Payer: Self-pay | Admitting: *Deleted

## 2011-11-02 NOTE — Patient Instructions (Addendum)
20 ISA HITZ  11/02/2011   Your procedure is scheduled on:  11/09/2011  Report to [facility] at  615  AM.  Call this number if you have problems the morning of surgery: [phone]   Remember:   Do not eat food:After Midnight.  May have clear liquids:until Midnight .    Take these medicines the morning of surgery with A SIP OF WATER: levothyroxine,synthroid,diazepam,hydrocodone,phenergan   Do not wear jewelry, make-up or nail polish.    Do not shave 48 hours prior to surgery. Men may shave face and neck.  Do not bring valuables to the hospital.  Contacts, dentures or bridgework may not be worn into surgery.  Leave suitcase in the car. After surgery it may be brought to your room.  For patients admitted to the hospital, checkout time is 11:00 AM the day of discharge.   Patients discharged the day of surgery will not be allowed to drive home.  Name and phone number of your driver: family  Special Instructions: Shower using CHG 2 nights before surgery and the night before surgery.  If you shower the day of surgery use CHG.  Use special wash - you have one bottle of CHG for all showers.  You should use approximately 1/3 of the bottle for each shower.   Please read over the following fact sheets that you were given: Pain Booklet, Surgical Site Infection Prevention, Anesthesia Post-op Instructions and Care and Recovery After Surgery Colonoscopy A colonoscopy is an exam to evaluate your entire colon. In this exam, your colon is cleansed. A long fiberoptic tube is inserted through your rectum and into your colon. The fiberoptic scope (endoscope) is a long bundle of enclosed and very flexible fibers. These fibers transmit light to the area examined and send images from that area to your caregiver. Discomfort is usually minimal. You may be given a drug to help you sleep (sedative) during or prior to the procedure. This exam helps to detect lumps (tumors), polyps, inflammation, and areas of  bleeding. Your caregiver may also take a small piece of tissue (biopsy) that will be examined under a microscope. LET YOUR CAREGIVER KNOW ABOUT:   Allergies to food or medicine.   Medicines taken, including vitamins, herbs, eyedrops, over-the-counter medicines, and creams.   Use of steroids (by mouth or creams).   Previous problems with anesthetics or numbing medicines.   History of bleeding problems or blood clots.   Previous surgery.   Other health problems, including diabetes and kidney problems.   Possibility of pregnancy, if this applies.  BEFORE THE PROCEDURE   A clear liquid diet may be required for 2 days before the exam.   Ask your caregiver about changing or stopping your regular medications.   Liquid injections (enemas) or laxatives may be required.   A large amount of electrolyte solution may be given to you to drink over a short period of time. This solution is used to clean out your colon.   You should be present 60 minutes prior to your procedure or as directed by your caregiver.  AFTER THE PROCEDURE   If you received a sedative or pain relieving medication, you will need to arrange for someone to drive you home.   Occasionally, there is a little blood passed with the first bowel movement. Do not be concerned.  FINDING OUT THE RESULTS OF YOUR TEST Not all test results are available during your visit. If your test results are not back during  the visit, make an appointment with your caregiver to find out the results. Do not assume everything is normal if you have not heard from your caregiver or the medical facility. It is important for you to follow up on all of your test results. HOME CARE INSTRUCTIONS   It is not unusual to pass moderate amounts of gas and experience mild abdominal cramping following the procedure. This is due to air being used to inflate your colon during the exam. Walking or a warm pack on your belly (abdomen) may help.   You may resume all  normal meals and activities after sedatives and medicines have worn off.   Only take over-the-counter or prescription medicines for pain, discomfort, or fever as directed by your caregiver. Do not use aspirin or blood thinners if a biopsy was taken. Consult your caregiver for medicine usage if biopsies were taken.  SEEK IMMEDIATE MEDICAL CARE IF:   You have a fever.   You pass large blood clots or fill a toilet with blood following the procedure. This may also occur 10 to 14 days following the procedure. This is more likely if a biopsy was taken.   You develop abdominal pain that keeps getting worse and cannot be relieved with medicine.  Document Released: 01/28/2000 Document Revised: 01/19/2011 Document Reviewed: 09/12/2007 [facility] Patient Information 2012 [place], [place].PATIENT INSTRUCTIONS POST-ANESTHESIA  IMMEDIATELY FOLLOWING SURGERY:  Do not drive or operate machinery for the first twenty four hours after surgery.  Do not make any important decisions for twenty four hours after surgery or while taking narcotic pain medications or sedatives.  If you develop intractable nausea and vomiting or a severe headache please notify your doctor immediately.  FOLLOW-UP:  Please make an appointment with your surgeon as instructed. You do not need to follow up with anesthesia unless specifically instructed to do so.  WOUND CARE INSTRUCTIONS (if applicable):  Keep a dry clean dressing on the anesthesia/puncture wound site if there is drainage.  Once the wound has quit draining you may leave it open to air.  Generally you should leave the bandage intact for twenty four hours unless there is drainage.  If the epidural site drains for more than 36-48 hours please call the anesthesia department.  QUESTIONS?:  Please feel free to call your physician or the hospital operator if you have any questions, and they will be happy to assist you.

## 2011-11-02 NOTE — Telephone Encounter (Signed)
Tanya Gonzalez called today. She is still waiting to hear back on her ins approval for linzess, however she is almost out and wants to know if she can get some more free samples. Please call her back. Thanks.

## 2011-11-02 NOTE — Telephone Encounter (Signed)
I told Pt that we waiting on her insurance. I put her some samples up front.

## 2011-11-03 ENCOUNTER — Encounter (HOSPITAL_COMMUNITY)
Admission: RE | Admit: 2011-11-03 | Discharge: 2011-11-03 | Payer: Medicaid Other | Source: Ambulatory Visit | Attending: Internal Medicine | Admitting: Internal Medicine

## 2011-11-06 ENCOUNTER — Encounter (HOSPITAL_COMMUNITY): Payer: Self-pay

## 2011-11-06 ENCOUNTER — Encounter (HOSPITAL_COMMUNITY)
Admission: RE | Admit: 2011-11-06 | Discharge: 2011-11-06 | Disposition: A | Discharge status: Home / Self Care | Payer: Medicaid Other | Source: Ambulatory Visit | Attending: Internal Medicine | Admitting: Internal Medicine

## 2011-11-06 ENCOUNTER — Other Ambulatory Visit (HOSPITAL_COMMUNITY): Payer: Medicaid Other

## 2011-11-06 LAB — HEMOGLOBIN AND HEMATOCRIT, BLOOD: HCT: 37.4 % (ref 36.0–46.0)

## 2011-11-06 LAB — BASIC METABOLIC PANEL
CO2: 30 mEq/L (ref 19–32)
Glucose, Bld: 87 mg/dL (ref 70–99)
Potassium: 4.5 mEq/L (ref 3.5–5.1)
Sodium: 138 mEq/L (ref 135–145)

## 2011-11-06 NOTE — Telephone Encounter (Signed)
Pt was informed.

## 2011-11-09 ENCOUNTER — Encounter (HOSPITAL_COMMUNITY): Payer: Self-pay | Admitting: Anesthesiology

## 2011-11-09 ENCOUNTER — Ambulatory Visit (HOSPITAL_COMMUNITY)
Admission: RE | Admit: 2011-11-09 | Discharge: 2011-11-09 | Disposition: A | Discharge status: Home / Self Care | Payer: Medicaid Other | Source: Ambulatory Visit | Attending: Internal Medicine | Admitting: Internal Medicine

## 2011-11-09 ENCOUNTER — Telehealth: Payer: Self-pay | Admitting: Internal Medicine

## 2011-11-09 ENCOUNTER — Encounter (HOSPITAL_COMMUNITY): Payer: Self-pay

## 2011-11-09 ENCOUNTER — Encounter (HOSPITAL_COMMUNITY)
Admission: RE | Disposition: A | Discharge status: Home / Self Care | Payer: Self-pay | Source: Ambulatory Visit | Attending: Internal Medicine

## 2011-11-09 ENCOUNTER — Ambulatory Visit (HOSPITAL_COMMUNITY): Payer: Medicaid Other | Admitting: Anesthesiology

## 2011-11-09 DIAGNOSIS — K921 Melena: Secondary | ICD-10-CM | POA: Insufficient documentation

## 2011-11-09 DIAGNOSIS — K5900 Constipation, unspecified: Secondary | ICD-10-CM

## 2011-11-09 DIAGNOSIS — K5909 Other constipation: Secondary | ICD-10-CM | POA: Insufficient documentation

## 2011-11-09 DIAGNOSIS — Z01812 Encounter for preprocedural laboratory examination: Secondary | ICD-10-CM | POA: Insufficient documentation

## 2011-11-09 DIAGNOSIS — R109 Unspecified abdominal pain: Secondary | ICD-10-CM | POA: Insufficient documentation

## 2011-11-09 DIAGNOSIS — Z79899 Other long term (current) drug therapy: Secondary | ICD-10-CM | POA: Insufficient documentation

## 2011-11-09 HISTORY — PX: COLONOSCOPY: SHX174

## 2011-11-09 SURGERY — COLONOSCOPY WITH PROPOFOL
Anesthesia: Monitor Anesthesia Care | Site: Rectum

## 2011-11-09 MED ORDER — LIDOCAINE HCL (PF) 1 % IJ SOLN
INTRAMUSCULAR | Status: AC
  Filled 2011-11-09: qty 5

## 2011-11-09 MED ORDER — MIDAZOLAM HCL 2 MG/2ML IJ SOLN
INTRAMUSCULAR | Status: AC
  Filled 2011-11-09: qty 2

## 2011-11-09 MED ORDER — PROPOFOL 10 MG/ML IV EMUL
INTRAVENOUS | Status: AC
  Filled 2011-11-09: qty 20

## 2011-11-09 MED ORDER — DEXAMETHASONE SODIUM PHOSPHATE 4 MG/ML IJ SOLN
INTRAMUSCULAR | Status: AC
  Filled 2011-11-09: qty 1

## 2011-11-09 MED ORDER — MIDAZOLAM HCL 5 MG/5ML IJ SOLN
INTRAMUSCULAR | Status: DC | PRN
  Administered 2011-11-09: 2 mg via INTRAVENOUS

## 2011-11-09 MED ORDER — ONDANSETRON HCL 4 MG/2ML IJ SOLN
INTRAMUSCULAR | Status: AC
  Filled 2011-11-09: qty 2

## 2011-11-09 MED ORDER — FENTANYL CITRATE 0.05 MG/ML IJ SOLN
INTRAMUSCULAR | Status: AC
  Filled 2011-11-09: qty 2

## 2011-11-09 MED ORDER — GLYCOPYRROLATE 0.2 MG/ML IJ SOLN
INTRAMUSCULAR | Status: DC | PRN
  Administered 2011-11-09: 0.2 mg via INTRAVENOUS

## 2011-11-09 MED ORDER — GLYCOPYRROLATE 0.2 MG/ML IJ SOLN
0.2000 mg | Freq: Once | INTRAMUSCULAR | Status: AC
  Administered 2011-11-09: 0.2 mg via INTRAVENOUS

## 2011-11-09 MED ORDER — MIDAZOLAM HCL 2 MG/2ML IJ SOLN
1.0000 mg | INTRAMUSCULAR | Status: DC | PRN
  Administered 2011-11-09: 2 mg via INTRAVENOUS

## 2011-11-09 MED ORDER — ONDANSETRON HCL 4 MG/2ML IJ SOLN: 4.0000 mg | Freq: Once | INTRAMUSCULAR | Status: DC | PRN

## 2011-11-09 MED ORDER — DEXAMETHASONE SODIUM PHOSPHATE 4 MG/ML IJ SOLN
4.0000 mg | Freq: Once | INTRAMUSCULAR | Status: AC
  Administered 2011-11-09: 4 mg via INTRAVENOUS

## 2011-11-09 MED ORDER — LACTATED RINGERS IV SOLN
INTRAVENOUS | Status: DC
  Administered 2011-11-09: 08:00:00 via INTRAVENOUS

## 2011-11-09 MED ORDER — PROPOFOL 10 MG/ML IV BOLUS
INTRAVENOUS | Status: DC | PRN
  Administered 2011-11-09: 20 mg via INTRAVENOUS
  Administered 2011-11-09: 10 mg via INTRAVENOUS
  Administered 2011-11-09: 20 mg via INTRAVENOUS

## 2011-11-09 MED ORDER — GLYCOPYRROLATE 0.2 MG/ML IJ SOLN
INTRAMUSCULAR | Status: AC
  Filled 2011-11-09: qty 1

## 2011-11-09 MED ORDER — ONDANSETRON HCL 4 MG/2ML IJ SOLN
4.0000 mg | Freq: Once | INTRAMUSCULAR | Status: AC
  Administered 2011-11-09: 4 mg via INTRAVENOUS

## 2011-11-09 MED ORDER — FENTANYL CITRATE 0.05 MG/ML IJ SOLN
INTRAMUSCULAR | Status: DC | PRN
  Administered 2011-11-09: 50 ug via INTRAVENOUS

## 2011-11-09 MED ORDER — PROPOFOL INFUSION 10 MG/ML OPTIME
INTRAVENOUS | Status: DC | PRN
  Administered 2011-11-09: 100 ug/kg/min via INTRAVENOUS

## 2011-11-09 MED ORDER — SIMETHICONE 40 MG/0.6ML PO SUSP
ORAL | Status: DC | PRN
  Administered 2011-11-09: 08:00:00

## 2011-11-09 MED ORDER — FENTANYL CITRATE 0.05 MG/ML IJ SOLN
25.0000 ug | INTRAMUSCULAR | Status: DC | PRN
Start: 2011-11-09 — End: 2011-11-09

## 2011-11-09 SURGICAL SUPPLY — 22 items
ELECT REM PT RETURN 9FT ADLT (ELECTROSURGICAL)
ELECTRODE REM PT RTRN 9FT ADLT (ELECTROSURGICAL) IMPLANT
FCP BXJMBJMB 240X2.8X (CUTTING FORCEPS)
FLOOR PAD 36X40 (MISCELLANEOUS) ×2
FORCEPS BIOP RAD 4 LRG CAP 4 (CUTTING FORCEPS) IMPLANT
FORCEPS BIOP RJ4 240 W/NDL (CUTTING FORCEPS)
FORCEPS BXJMBJMB 240X2.8X (CUTTING FORCEPS) IMPLANT
INJECTOR/SNARE I SNARE (MISCELLANEOUS) IMPLANT
LUBRICANT JELLY 4.5OZ STERILE (MISCELLANEOUS) ×1 IMPLANT
MANIFOLD NEPTUNE II (INSTRUMENTS) ×1 IMPLANT
NDL SCLEROTHERAPY 25GX240 (NEEDLE) IMPLANT
NEEDLE SCLEROTHERAPY 25GX240 (NEEDLE) IMPLANT
PAD FLOOR 36X40 (MISCELLANEOUS) IMPLANT
PROBE APC STR FIRE (PROBE) IMPLANT
PROBE INJECTION GOLD (MISCELLANEOUS)
PROBE INJECTION GOLD 7FR (MISCELLANEOUS) IMPLANT
SNARE ROTATE MED OVAL 20MM (MISCELLANEOUS) IMPLANT
SYR 50ML LL SCALE MARK (SYRINGE) ×1 IMPLANT
TRAP SPECIMEN MUCOUS 40CC (MISCELLANEOUS) IMPLANT
TUBING ENDO SMARTCAP PENTAX (MISCELLANEOUS) ×1 IMPLANT
TUBING IRRIGATION ENDOGATOR (MISCELLANEOUS) ×1 IMPLANT
WATER STERILE IRR 1000ML POUR (IV SOLUTION) ×1 IMPLANT

## 2011-11-09 NOTE — Telephone Encounter (Signed)
Referral has been sent to [facility] and the office will contact the patient to schedule date and time and they will also notify the office of date and time as well

## 2011-11-09 NOTE — H&P (View-Only) (Signed)
Primary Care Physician:  Kirstie Peri, MD  Primary Gastroenterologist:    Chief Complaint  Patient presents with  . Rectal Bleeding  . Bloated  . Abdominal Pain    right lower side  . Constipation    HPI:  Tanya Gonzalez is a 36 y.o. female here for further evaluation of abdominal pain and rectal bleeding. She was last seen in 06/2010. She has h/o chronic nausea, heartburn, IBS. Patient has been on amitiza for one year. She called 10/19/11 with c/o pressure in her rectum, constipation, brbpr. Anusol suppositories called in but patient did not fill as insurance would not cover. She was advised to add high fiber diet/Miralax. She had CBC done. Results are below. Reportedly went to ED last week for same but waited four hours and had not been seen so she left.  Taking Amitiza two in am and two in pm. Does not always take regular. But for last two months has taken at least three days weekly. For past 6 weeks, pressure in RLQ pain. Constant discomfort but sometimes worse (stabbing). Nausea a lot. Does not use Phenergan daily. Started with rectal bleeding with hard stool. Big hemorrhoids. Hurts to sit down a lot of the time. Lots of pressure in lower abdomen. No heartburn or indigestion on pantoprazole. Interstitial cystitis stable per patient. May go one week without BM. Lot of straining involved.   Current Outpatient Prescriptions  Medication Sig Dispense Refill  . diazepam (DIASTAT) 2.5 MG GEL Place 2.5 mg vaginally at bedtime.        Marland Kitchen HYDROcodone-acetaminophen (LORTAB) 7.5-500 MG per tablet Take 1 tablet by mouth every 6 (six) hours as needed.        Marland Kitchen ibuprofen (ADVIL,MOTRIN) 200 MG tablet Take 200 mg by mouth every 6 (six) hours as needed.        Marland Kitchen levothyroxine (SYNTHROID, LEVOTHROID) 125 MCG tablet Take 125 mcg by mouth daily.        . pantoprazole (PROTONIX) 40 MG tablet Take 40 mg by mouth daily.        . promethazine (PHENERGAN) 25 MG tablet take 1 tablet by mouth every 6 hours if needed  20  tablet  0  .  amitiza bid   11  .     0    Allergies as of 10/23/2011 - Review Complete 10/23/2011  Allergen Reaction Noted  . Codeine      Past Medical History  Diagnosis Date  . IC (interstitial cystitis)   . Fibromyalgia   . Bursitis   . Hypothyroidism   . IBS (irritable bowel syndrome)     Past Surgical History  Procedure Date  . Complete hysterectomy 2003/2004    initially vaginal hysterectomy with left SOO in 2003, then right SOO in 2004  . Cholecystectomy   . Cystoscopy 2011  . Esophagogastroduodenoscopy 01/2010    gastritis, benign bx, bx neg for Celiac  . Colonoscopy     internal hemorrhoids, normal terminal ileum. patient reports being very uncomfortable during and after TCS. Versed 4mg  Demerol 100mg      Family History  Problem Relation Age of Onset  . Colon cancer Neg Hx   . Lung cancer Other     maternal great uncle  . Throat cancer Other     maternal great uncle  . Ovarian cancer Maternal Aunt   . Cervical cancer Maternal Aunt   . Crohn's disease Maternal Grandmother     History   Social History  . Marital Status: Single  Spouse Name: N/A    Number of Children: 4  . Years of Education: N/A   Occupational History  .     Social History Main Topics  . Smoking status: Current Everyday Smoker -- 0.5 packs/day    Types: Cigarettes  . Smokeless tobacco: Not on file  . Alcohol Use: Yes     once a month  . Drug Use: No  . Sexually Active: Yes    Birth Control/ Protection: Surgical   Other Topics Concern  . Not on file   Social History Narrative  . No narrative on file      ROS:  General: Negative for anorexia, weight loss, fever, chills, fatigue, weakness. Eyes: Negative for vision changes.  ENT: Negative for hoarseness, difficulty swallowing , nasal congestion. CV: Negative for chest pain, angina, palpitations, dyspnea on exertion, peripheral edema.  Respiratory: Negative for dyspnea at rest, dyspnea on exertion, cough,  sputum, wheezing.  GI: See history of present illness. GU:  Negative for dysuria, hematuria, urinary incontinence. Some urinary frequency.  MS: Negative for joint pain, low back pain.  Derm: Negative for rash or itching.  Neuro: Negative for weakness, abnormal sensation, seizure, frequent headaches, memory loss, confusion.  Psych: Negative for anxiety, depression, suicidal ideation, hallucinations.  Endo: Negative for unusual weight change.  Heme: Negative for bruising or bleeding. Allergy: Negative for rash or hives.    Physical Examination:  BP 98/67  Pulse 77  Temp 97.2 F (36.2 C) (Temporal)  Ht 5\' 2"  (1.575 m)  Wt 119 lb 3.2 oz (54.069 kg)  BMI 21.80 kg/m2   General: Well-nourished, well-developed in no acute distress.  Head: Normocephalic, atraumatic.   Eyes: Conjunctiva pink, no icterus. Mouth: Oropharyngeal mucosa moist and pink , no lesions erythema or exudate. Neck: Supple without thyromegaly, masses, or lymphadenopathy.  Lungs: Clear to auscultation bilaterally.  Heart: Regular rate and rhythm, no murmurs rubs or gallops.  Abdomen: Bowel sounds are normal, moderate tenderness right of midline just at and below umbilicus. nondistended, no hepatosplenomegaly or masses, no abdominal bruits or    hernia , no rebound or guarding.   Rectal: small ext hemorrhoid. DRE nontender, soft brown stool heme negative.  Extremities: No lower extremity edema. No clubbing or deformities.  Neuro: Alert and oriented x 4 , grossly normal neurologically.  Skin: Warm and dry, no rash or jaundice.   Psych: Alert and cooperative, normal mood and affect.  Labs: Lab Results  Component Value Date   WBC 6.8 10/19/2011   HGB 13.9 10/19/2011   HCT 40.6 10/19/2011   MCV 95.8 10/19/2011   PLT 231 10/19/2011     Imaging Studies: No results found.

## 2011-11-09 NOTE — Anesthesia Preprocedure Evaluation (Signed)
Anesthesia Evaluation  Patient identified by MRN, date of birth, ID band Patient awake    Reviewed: Allergy & Precautions, H&P , NPO status , Patient's Chart, lab work & pertinent test results  Airway Mallampati: I TM Distance: >3 FB     Dental  (+) Teeth Intact and Implants   Pulmonary Current Smoker,  breath sounds clear to auscultation        Cardiovascular negative cardio ROS  Rhythm:Regular     Neuro/Psych Anxiety    GI/Hepatic GERD-  Medicated and Controlled,  Endo/Other  Hypothyroidism   Renal/GU      Musculoskeletal  (+) Fibromyalgia -  Abdominal   Peds  Hematology   Anesthesia Other Findings   Reproductive/Obstetrics                           Anesthesia Physical Anesthesia Plan  ASA: II  Anesthesia Plan: MAC   Post-op Pain Management:    Induction: Intravenous  Airway Management Planned: Simple Face Mask  Additional Equipment:   Intra-op Plan:   Post-operative Plan:   Informed Consent: I have reviewed the patients History and Physical, chart, labs and discussed the procedure including the risks, benefits and alternatives for the proposed anesthesia with the patient or authorized representative who has indicated his/her understanding and acceptance.     Plan Discussed with:   Anesthesia Plan Comments:         Anesthesia Quick Evaluation

## 2011-11-09 NOTE — Anesthesia Postprocedure Evaluation (Signed)
  Anesthesia Post-op Note  Patient: Tanya Gonzalez  Procedure(s) Performed: Procedure(s) (LRB) with comments: COLONOSCOPY WITH PROPOFOL (N/A) - entered cecum @ 0803 ; total cecal withdrawal time = 8  minutes   Patient Location: PACU  Anesthesia Type: MAC  Level of Consciousness: awake  Airway and Oxygen Therapy: Patient Spontanous Breathing  Post-op Pain: none  Post-op Assessment: Post-op Vital signs reviewed, Patient's Cardiovascular Status Stable, Respiratory Function Stable, Patent Airway and No signs of Nausea or vomiting  Post-op Vital Signs: Reviewed and stable  Complications: No apparent anesthesia complications

## 2011-11-09 NOTE — Op Note (Signed)
[facility] [address] [place], [address]   COLONOSCOPY PROCEDURE REPORT  PATIENT: Tanya Gonzalez, Tanya Gonzalez  MR#:         [id number] BIRTHDATE: 1975/02/18 , 35  yrs. old GENDER: Female ENDOSCOPIST: R.  Roetta Sessions, MD FACP FACG REFERRED BY:  Kirstie Peri, M.D. PROCEDURE DATE:  11/09/2011 PROCEDURE:    Diagnostic ileocolonoscopy  INDICATIONS: paper hematochezia; refractory constipation  INFORMED CONSENT:  The risks, benefits, alternatives and imponderables including but not limited to bleeding, perforation as well as the possibility of a missed lesion have been reviewed.  The potential for biopsy, lesion removal, etc. have also been discussed.  Questions have been answered.  All parties agreeable. Please see the history and physical in the medical record for more information.  MEDICATIONS: deep sedation per Dr. [facility]  DESCRIPTION OF PROCEDURE:  After a digital rectal exam was performed, the     colonoscope was advanced from the anus through the rectum and colon to the area of the cecum, ileocecal valve and appendiceal orifice.  The cecum was deeply intubated.  These structures were well-seen and photographed for the record.  From the level of the cecum and ileocecal valve, the scope was slowly and cautiously withdrawn.  The mucosal surfaces were carefully surveyed utilizing scope tip deflection to facilitate fold flattening as needed.  The scope was pulled down into the rectum where a thorough examination including retroflexion was performed.     FINDINGS:  Excellent preparation. Normal rectum. No significant hemorrhoids found. Normal colonic mucosa appeared normal distal 10 cm of terminal ileal mucosa  THERAPEUTIC / DIAGNOSTIC MANEUVERS PERFORMED:  none  COMPLICATIONS: none  CECAL WITHDRAWAL TIME:  7 minutes  IMPRESSION:  Normal rectum, colon and terminal ileum. I suspect trivial and rectal bleeding. 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  RECOMMENDATIONS: Change in bowel regimen from Amitiza to Linzess. As previously recommended, patient is to go by my office for free samples.  Also, we'll set patient up for Anp-rectal manometry down at the [facility].   _______________________________ eSigned:  R. Roetta Sessions, MD FACP [facility] 11/09/2011 9:14 AM   CC:    PATIENT NAME:  Tanya Gonzalez, Tanya Gonzalez MR#: [id number]

## 2011-11-09 NOTE — Telephone Encounter (Signed)
Procedure is scheduled at [facility] on Nov 4th at 12:30 and patient is aware

## 2011-11-09 NOTE — Interval H&P Note (Signed)
History and Physical Interval Note:  11/09/2011 7:37 AM  Tanya Gonzalez  has presented today for surgery, with the diagnosis of Rectal Bleeding  The various methods of treatment have been discussed with the patient and family. After consideration of risks, benefits and other options for treatment, the patient has consented to  Procedure(s) (LRB) with comments: COLONOSCOPY WITH PROPOFOL (N/A) as a surgical intervention .  The patient's history has been reviewed, patient examined, no change in status, stable for surgery.  I have reviewed the patient's chart and labs.  Questions were answered to the patient's satisfaction.     [name]

## 2011-11-09 NOTE — Transfer of Care (Signed)
Immediate Anesthesia Transfer of Care Note  Patient: Tanya Gonzalez  Procedure(s) Performed: Procedure(s) (LRB) with comments: COLONOSCOPY WITH PROPOFOL (N/A) - entered cecum @ 0803 ; total cecal withdrawal time = 8  minutes   Patient Location: PACU  Anesthesia Type: MAC  Level of Consciousness: awake, alert  and oriented  Airway & Oxygen Therapy: Patient Spontanous Breathing  Post-op Assessment: Report given to PACU RN  Post vital signs: Reviewed and stable  Complications: No apparent anesthesia complications

## 2011-11-26 IMAGING — US US ABDOMEN COMPLETE
1 series · 14 of 25 positions shown · non-contrast
Comparison: None

CLINICAL DATA: Postprandial abdominal pain

ULTRASOUND ABDOMEN:
TECHNIQUE: Sonography of upper abdominal structures was performed.

[Series 1: us abdomen complete · 0.26mm/px · 14 of 69 slices shown]
[im 1/69]
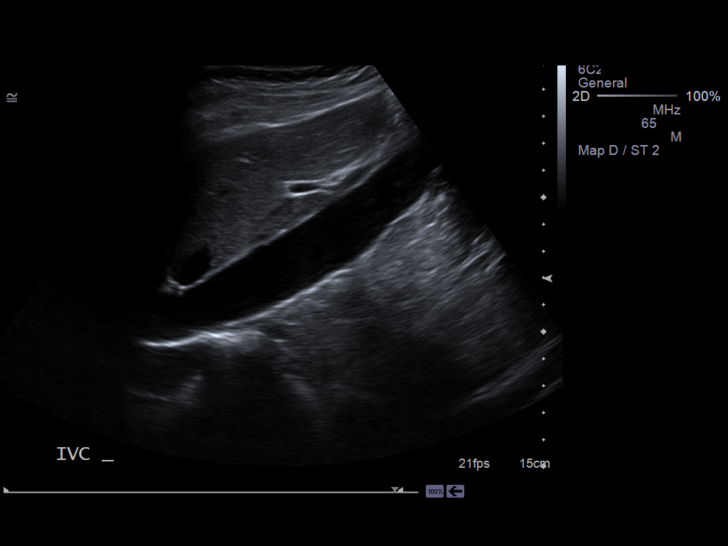
[im 6/69]
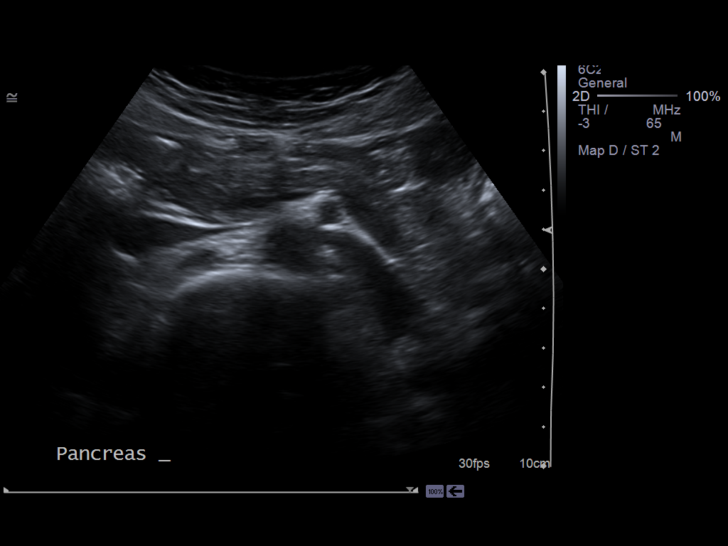
[im 12/69]
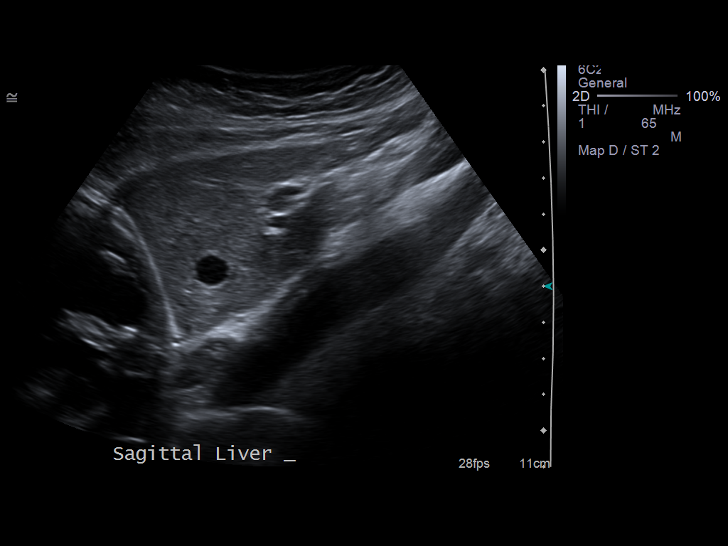
[im 18/69]
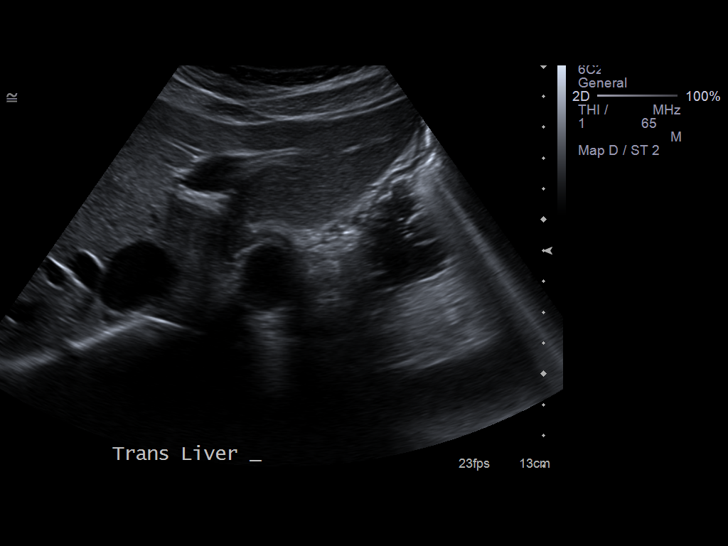
[im 23/69]
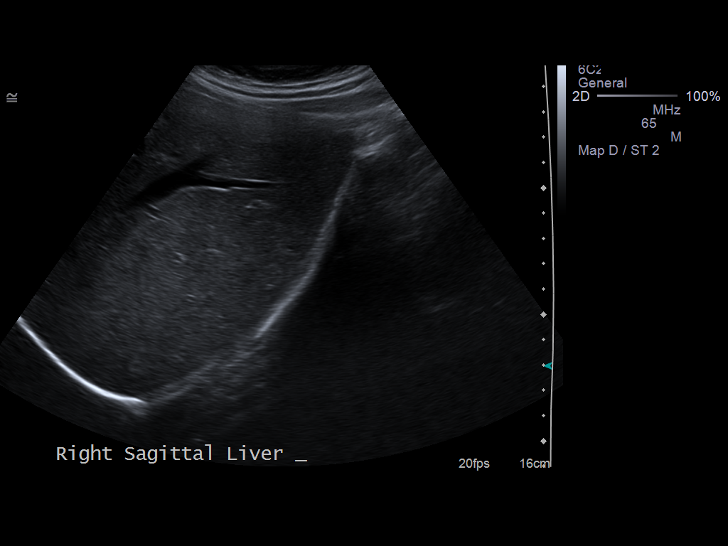
[im 26/69]
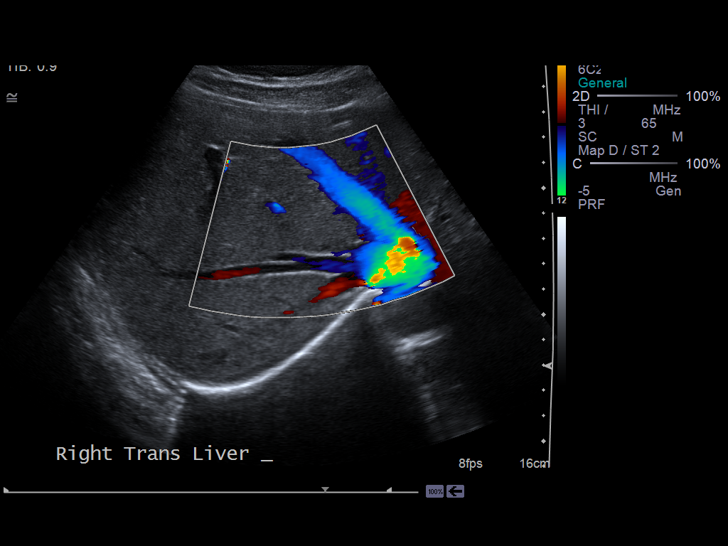
[im 32/69]
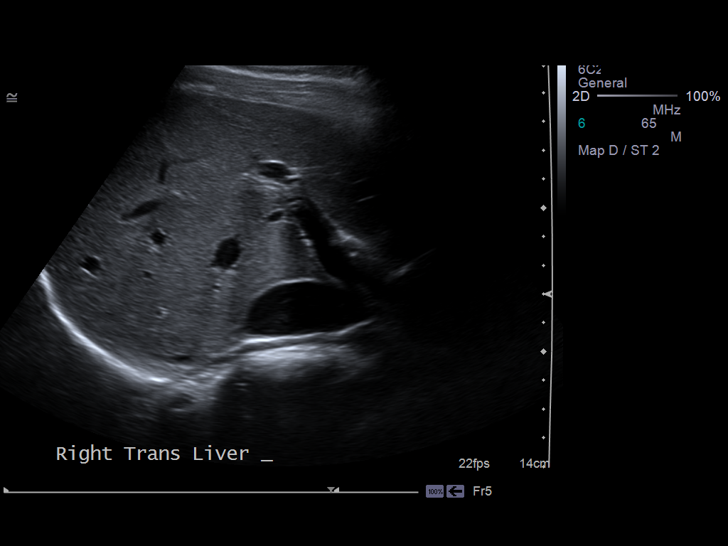
[im 37/69]
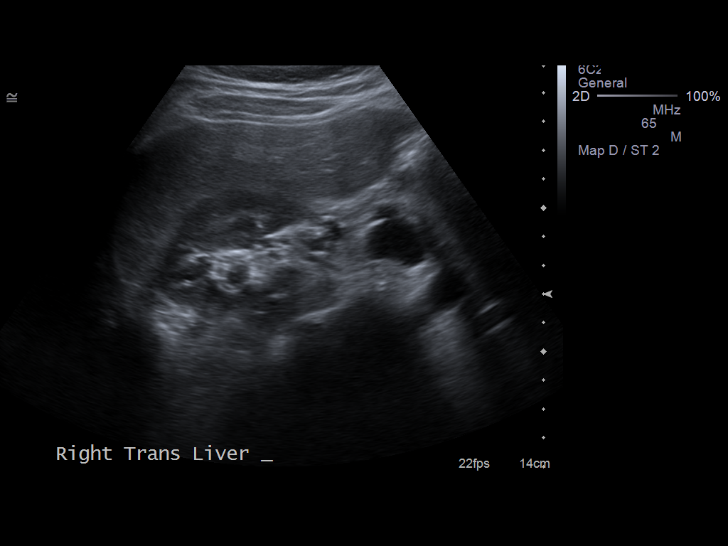
[im 43/69]
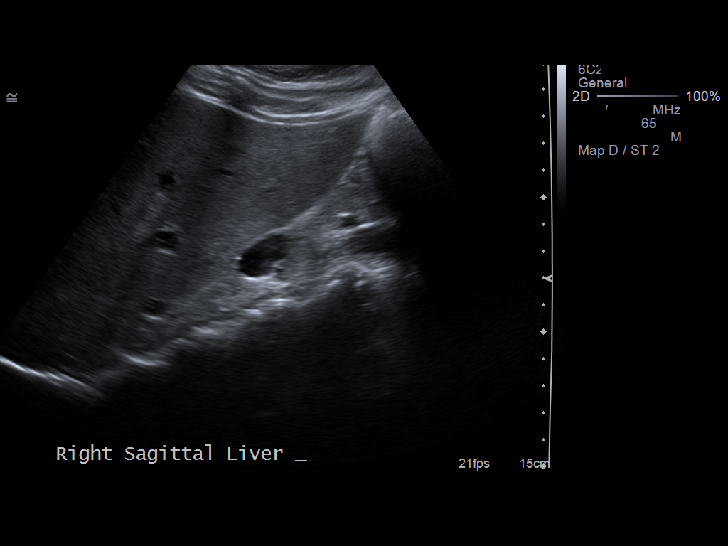
[im 46/69]
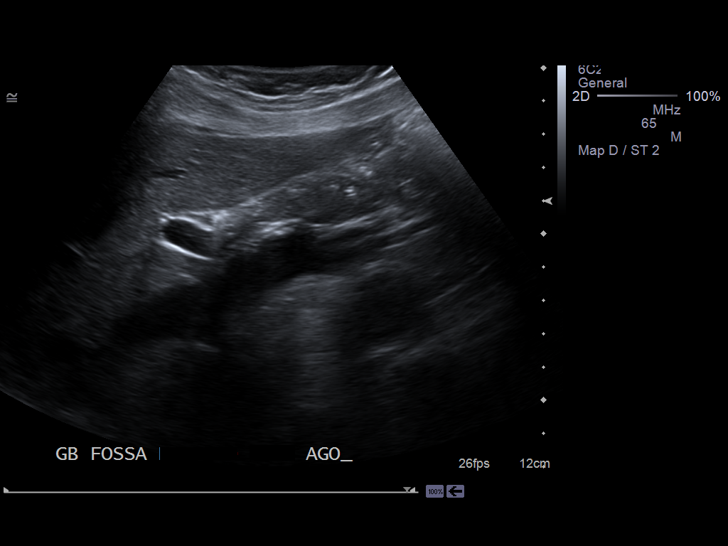
[im 52/69]
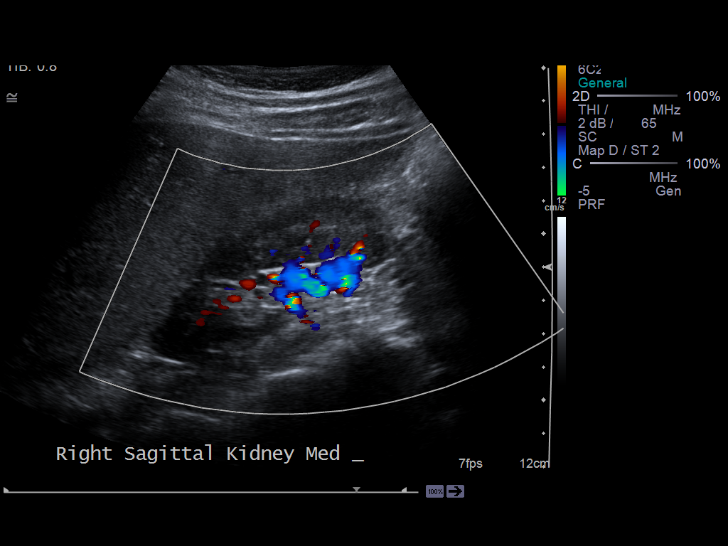
[im 57/69]
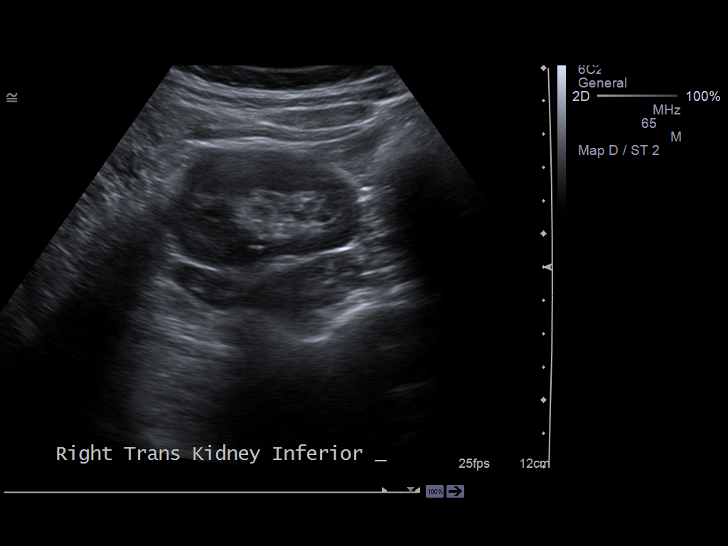
[im 63/69]
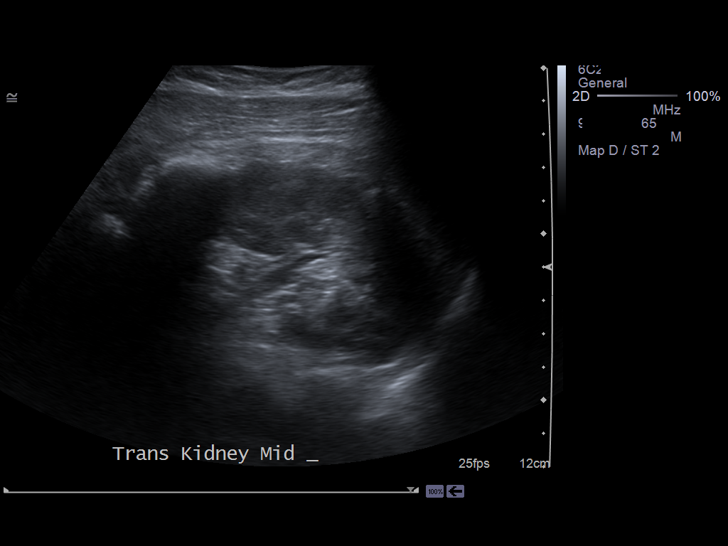
[im 69/69]
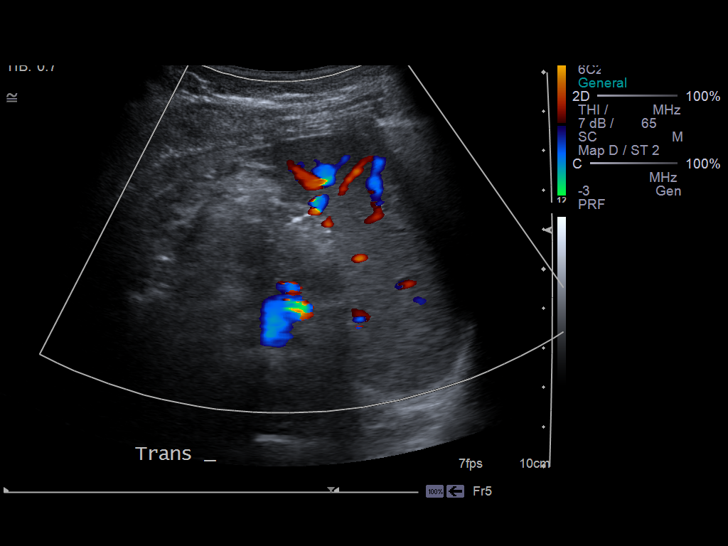

[14 of 25 positions shown; findings below may reference images not displayed]

Gallbladder:  Surgically absent

Common bile duct:  3 mm diameter, normal

Liver:  Normal appearance

IVC:  Unremarkable

Pancreas:  Incomplete visualization of pancreatic head due to bowel
gas, with visualized portions normal appearance

Spleen:  Normal appearance, 4.4 cm length

Right kidney:  10.2 cm length. Normal morphology without mass or
hydronephrosis.

Left kidney:  10.6 cm length. Normal morphology without mass or
hydronephrosis.

Aorta:  Unremarkable

Other:  No free fluid
IMPRESSION: Incomplete pancreatic visualization.
Otherwise normal exam.

## 2011-11-30 ENCOUNTER — Telehealth: Payer: Self-pay | Admitting: Internal Medicine

## 2011-11-30 NOTE — Telephone Encounter (Signed)
Pt called yesterday to follow up with her prior authorization where her medicaid would not cover her prescription. She asked if she could get samples to hold her over. Please advise and call patient back.

## 2011-12-01 NOTE — Telephone Encounter (Signed)
Tried to call Tanya Gonzalez, samples at front desk. Still waiting to hear from Medicaid.

## 2012-01-25 ENCOUNTER — Encounter: Payer: Self-pay | Admitting: Internal Medicine

## 2012-10-14 DIAGNOSIS — G5601 Carpal tunnel syndrome, right upper limb: Secondary | ICD-10-CM

## 2012-10-14 HISTORY — DX: Carpal tunnel syndrome, right upper limb: G56.01

## 2012-10-21 ENCOUNTER — Other Ambulatory Visit: Payer: Self-pay | Admitting: Orthopedic Surgery

## 2012-10-22 ENCOUNTER — Encounter (HOSPITAL_BASED_OUTPATIENT_CLINIC_OR_DEPARTMENT_OTHER): Payer: Self-pay | Admitting: *Deleted

## 2012-10-29 ENCOUNTER — Encounter (HOSPITAL_BASED_OUTPATIENT_CLINIC_OR_DEPARTMENT_OTHER)
Admission: RE | Disposition: A | Discharge status: Home / Self Care | Payer: Self-pay | Source: Ambulatory Visit | Attending: Orthopedic Surgery

## 2012-10-29 ENCOUNTER — Ambulatory Visit (HOSPITAL_BASED_OUTPATIENT_CLINIC_OR_DEPARTMENT_OTHER)
Admission: RE | Admit: 2012-10-29 | Discharge: 2012-10-29 | Disposition: A | Discharge status: Home / Self Care | Payer: Medicaid Other | Source: Ambulatory Visit | Attending: Orthopedic Surgery | Admitting: Orthopedic Surgery

## 2012-10-29 ENCOUNTER — Ambulatory Visit (HOSPITAL_BASED_OUTPATIENT_CLINIC_OR_DEPARTMENT_OTHER): Payer: Medicaid Other | Admitting: Anesthesiology

## 2012-10-29 ENCOUNTER — Encounter (HOSPITAL_BASED_OUTPATIENT_CLINIC_OR_DEPARTMENT_OTHER): Payer: Self-pay | Admitting: Anesthesiology

## 2012-10-29 ENCOUNTER — Encounter (HOSPITAL_BASED_OUTPATIENT_CLINIC_OR_DEPARTMENT_OTHER): Payer: Self-pay | Admitting: *Deleted

## 2012-10-29 DIAGNOSIS — IMO0001 Reserved for inherently not codable concepts without codable children: Secondary | ICD-10-CM | POA: Insufficient documentation

## 2012-10-29 DIAGNOSIS — G5600 Carpal tunnel syndrome, unspecified upper limb: Secondary | ICD-10-CM | POA: Insufficient documentation

## 2012-10-29 DIAGNOSIS — J4489 Other specified chronic obstructive pulmonary disease: Secondary | ICD-10-CM | POA: Insufficient documentation

## 2012-10-29 DIAGNOSIS — J449 Chronic obstructive pulmonary disease, unspecified: Secondary | ICD-10-CM | POA: Insufficient documentation

## 2012-10-29 DIAGNOSIS — M47812 Spondylosis without myelopathy or radiculopathy, cervical region: Secondary | ICD-10-CM | POA: Insufficient documentation

## 2012-10-29 HISTORY — DX: Carpal tunnel syndrome, right upper limb: G56.01

## 2012-10-29 HISTORY — DX: Other specified health status: Z78.9

## 2012-10-29 HISTORY — DX: Migraine, unspecified, not intractable, without status migrainosus: G43.909

## 2012-10-29 HISTORY — DX: Gastro-esophageal reflux disease without esophagitis: K21.9

## 2012-10-29 HISTORY — PX: CARPAL TUNNEL RELEASE: SHX101

## 2012-10-29 SURGERY — CARPAL TUNNEL RELEASE
Anesthesia: Monitor Anesthesia Care | Site: Wrist | Laterality: Right | Wound class: Clean

## 2012-10-29 MED ORDER — CHLORHEXIDINE GLUCONATE 4 % EX LIQD: 60.0000 mL | Freq: Once | CUTANEOUS | Status: DC

## 2012-10-29 MED ORDER — FENTANYL CITRATE 0.05 MG/ML IJ SOLN: 50.0000 ug | Freq: Once | INTRAMUSCULAR | Status: DC

## 2012-10-29 MED ORDER — CEFAZOLIN SODIUM-DEXTROSE 2-3 GM-% IV SOLR
2.0000 g | INTRAVENOUS | Status: AC
  Administered 2012-10-29: 2 g via INTRAVENOUS

## 2012-10-29 MED ORDER — FENTANYL CITRATE 0.05 MG/ML IJ SOLN: 50.0000 ug | INTRAMUSCULAR | Status: DC | PRN

## 2012-10-29 MED ORDER — MIDAZOLAM HCL 2 MG/2ML IJ SOLN: 1.0000 mg | INTRAMUSCULAR | Status: DC | PRN

## 2012-10-29 MED ORDER — MIDAZOLAM HCL 2 MG/ML PO SYRP: 12.0000 mg | ORAL_SOLUTION | Freq: Once | ORAL | Status: DC | PRN

## 2012-10-29 MED ORDER — FENTANYL CITRATE 0.05 MG/ML IJ SOLN
INTRAMUSCULAR | Status: DC | PRN
  Administered 2012-10-29: 50 ug via INTRAVENOUS

## 2012-10-29 MED ORDER — HYDROMORPHONE HCL PF 1 MG/ML IJ SOLN: 0.2500 mg | INTRAMUSCULAR | Status: DC | PRN

## 2012-10-29 MED ORDER — OXYCODONE HCL 5 MG PO TABS
5.0000 mg | ORAL_TABLET | Freq: Once | ORAL | Status: AC
  Administered 2012-10-29: 5 mg via ORAL

## 2012-10-29 MED ORDER — LACTATED RINGERS IV SOLN
INTRAVENOUS | Status: DC
  Administered 2012-10-29: 07:00:00 via INTRAVENOUS

## 2012-10-29 MED ORDER — ONDANSETRON HCL 4 MG/2ML IJ SOLN
INTRAMUSCULAR | Status: DC | PRN
  Administered 2012-10-29: 4 mg via INTRAVENOUS

## 2012-10-29 MED ORDER — PROPOFOL INFUSION 10 MG/ML OPTIME
INTRAVENOUS | Status: DC | PRN
  Administered 2012-10-29: 100 ug/kg/min via INTRAVENOUS

## 2012-10-29 MED ORDER — LIDOCAINE HCL (CARDIAC) 20 MG/ML IV SOLN
INTRAVENOUS | Status: DC | PRN
  Administered 2012-10-29: 30 mg via INTRAVENOUS

## 2012-10-29 MED ORDER — PROMETHAZINE HCL 25 MG/ML IJ SOLN: 6.2500 mg | INTRAMUSCULAR | Status: DC | PRN

## 2012-10-29 MED ORDER — MIDAZOLAM HCL 5 MG/5ML IJ SOLN
INTRAMUSCULAR | Status: DC | PRN
  Administered 2012-10-29: 2 mg via INTRAVENOUS

## 2012-10-29 MED ORDER — BUPIVACAINE HCL (PF) 0.25 % IJ SOLN
INTRAMUSCULAR | Status: DC | PRN
  Administered 2012-10-29: 5 mL

## 2012-10-29 SURGICAL SUPPLY — 41 items
BANDAGE GAUZE ELAST BULKY 4 IN (GAUZE/BANDAGES/DRESSINGS) ×2 IMPLANT
BLADE SURG 15 STRL LF DISP TIS (BLADE) ×1 IMPLANT
BLADE SURG 15 STRL SS (BLADE) ×2
BNDG CMPR 9X4 STRL LF SNTH (GAUZE/BANDAGES/DRESSINGS)
BNDG COHESIVE 3X5 TAN STRL LF (GAUZE/BANDAGES/DRESSINGS) ×2 IMPLANT
BNDG ESMARK 4X9 LF (GAUZE/BANDAGES/DRESSINGS) IMPLANT
CHLORAPREP W/TINT 26ML (MISCELLANEOUS) ×2 IMPLANT
CLOTH BEACON ORANGE TIMEOUT ST (SAFETY) ×2 IMPLANT
CORDS BIPOLAR (ELECTRODE) ×2 IMPLANT
COVER MAYO STAND STRL (DRAPES) ×2 IMPLANT
COVER TABLE BACK 60X90 (DRAPES) ×2 IMPLANT
CUFF TOURNIQUET SINGLE 18IN (TOURNIQUET CUFF) ×2 IMPLANT
DRAPE EXTREMITY T 121X128X90 (DRAPE) ×2 IMPLANT
DRAPE SURG 17X23 STRL (DRAPES) ×2 IMPLANT
DRSG KUZMA FLUFF (GAUZE/BANDAGES/DRESSINGS) ×2 IMPLANT
GAUZE XEROFORM 1X8 LF (GAUZE/BANDAGES/DRESSINGS) ×2 IMPLANT
GLOVE BIO SURGEON STRL SZ 6.5 (GLOVE) ×1 IMPLANT
GLOVE BIOGEL PI IND STRL 7.0 (GLOVE) IMPLANT
GLOVE BIOGEL PI IND STRL 8.5 (GLOVE) ×1 IMPLANT
GLOVE BIOGEL PI INDICATOR 7.0 (GLOVE) ×1
GLOVE BIOGEL PI INDICATOR 8.5 (GLOVE) ×1
GLOVE ECLIPSE 6.5 STRL STRAW (GLOVE) ×1 IMPLANT
GLOVE EXAM NITRILE EXT CUFF MD (GLOVE) ×1 IMPLANT
GLOVE SURG ORTHO 8.0 STRL STRW (GLOVE) ×2 IMPLANT
GOWN BRE IMP PREV XXLGXLNG (GOWN DISPOSABLE) ×2 IMPLANT
GOWN PREVENTION PLUS XLARGE (GOWN DISPOSABLE) ×2 IMPLANT
NEEDLE 27GAX1X1/2 (NEEDLE) ×1 IMPLANT
NS IRRIG 1000ML POUR BTL (IV SOLUTION) ×2 IMPLANT
PACK BASIN DAY SURGERY FS (CUSTOM PROCEDURE TRAY) ×2 IMPLANT
PAD CAST 3X4 CTTN HI CHSV (CAST SUPPLIES) ×1 IMPLANT
PADDING CAST ABS 4INX4YD NS (CAST SUPPLIES) ×1
PADDING CAST ABS COTTON 4X4 ST (CAST SUPPLIES) ×1 IMPLANT
PADDING CAST COTTON 3X4 STRL (CAST SUPPLIES) ×2
SPONGE GAUZE 4X4 12PLY (GAUZE/BANDAGES/DRESSINGS) ×2 IMPLANT
STOCKINETTE 4X48 STRL (DRAPES) ×2 IMPLANT
SUT VICRYL 4-0 PS2 18IN ABS (SUTURE) IMPLANT
SUT VICRYL RAPIDE 4/0 PS 2 (SUTURE) ×2 IMPLANT
SYR BULB 3OZ (MISCELLANEOUS) ×2 IMPLANT
SYR CONTROL 10ML LL (SYRINGE) ×1 IMPLANT
TOWEL OR 17X24 6PK STRL BLUE (TOWEL DISPOSABLE) ×2 IMPLANT
UNDERPAD 30X30 INCONTINENT (UNDERPADS AND DIAPERS) ×2 IMPLANT

## 2012-10-29 NOTE — Op Note (Signed)
Dictation number: [id number]
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
## 2012-10-29 NOTE — Anesthesia Preprocedure Evaluation (Signed)
Anesthesia Evaluation  Patient identified by MRN, date of birth, ID band Patient awake    Reviewed: Allergy & Precautions, H&P , NPO status , Patient's Chart, lab work & pertinent test results  Airway Mallampati: I TM Distance: <3 FB Neck ROM: Full    Dental   Pulmonary COPDCurrent Smoker,  + rhonchi         Cardiovascular Rhythm:Regular Rate:Normal     Neuro/Psych  Headaches,    GI/Hepatic GERD-  ,  Endo/Other    Renal/GU   Female GU complaint     Musculoskeletal  (+) Fibromyalgia -  Abdominal   Peds  Hematology   Anesthesia Other Findings   Reproductive/Obstetrics                           Anesthesia Physical Anesthesia Plan  ASA: II  Anesthesia Plan: MAC and Bier Block   Post-op Pain Management:    Induction: Intravenous  Airway Management Planned: Simple Face Mask  Additional Equipment:   Intra-op Plan:   Post-operative Plan:   Informed Consent: I have reviewed the patients History and Physical, chart, labs and discussed the procedure including the risks, benefits and alternatives for the proposed anesthesia with the patient or authorized representative who has indicated his/her understanding and acceptance.     Plan Discussed with: CRNA and Surgeon  Anesthesia Plan Comments:         Anesthesia Quick Evaluation

## 2012-10-29 NOTE — Op Note (Deleted)
NAMEVERLIA, KANEY NO.:  [id number]  MEDICAL RECORD NO.:  [id number]  LOCATION:  RAD                          FACILITY:  [facility]  PHYSICIAN:  Cindee Salt, M.D.       DATE OF BIRTH:  1975-02-26  DATE OF PROCEDURE:  10/29/2012 DATE OF DISCHARGE:  10/30/2011                              OPERATIVE REPORT   PREOPERATIVE DIAGNOSIS:  Carpal tunnel syndrome, right hand.  POSTOPERATIVE DIAGNOSIS:  Carpal tunnel syndrome, right hand.  OPERATION:  Decompression right median nerve.  SURGEON:  Cindee Salt, MD  ANESTHESIA:  Forearm-based IV regional, local infiltration.  ANESTHESIOLOGIST:  Dr. Gypsy Balsam.  HISTORY:  The patient is a 37 year old female with a history of carpal tunnel syndrome, nerve conductions positive, nonresponsive to conservative treatment.  She has elected to undergo surgical decompression of the right median nerve.  Pre, peri, and postoperative course have been discussed along with risks and complications.  She is aware that there was no guarantee with the surgery; possibility of infection; recurrence of injury to arteries, nerves, tendons, incomplete relief of symptoms, dystrophy.  In the preoperative area, the patient is seen, the extremity marked by both the patient and surgeon.  Antibiotic given.  PROCEDURE IN DETAIL:  The patient was brought to the operating room where a forearm-based IV regional anesthetic was carried out without difficulty.  She was prepped using ChloraPrep, supine position, right arm free.  A 3-minute dry time was allowed.  Time-out taken, confirming the patient and procedure.  A longitudinal incision was made in the right palm carried down through the subcutaneous tissue.  A large nerve was found to exit through the palmar fascia.  This was preserved.  The palmar fascia was split.  This was found to come off of the ulnar nerve penetrating up through the skin unfortunately to the radial side of the incision where it appeared  to terminate.  This was preserved throughout the procedure.  The flexor tendon in the ring and little finger were identified.  After identification of the superficial palmar arch, the palmar fascia was split.  The flexor retinaculum was then longitudinally incised with both blunt and sharp dissection, just to the radial side of the hamate hook protecting the neurovascular bundles.  Retractors were placed prior to the incision after identifying the flexor tendon to the ring little finger.  A right angle and Sewall retractor were placed between the skin and forearm fascia.  The fascia proximally was released for approximately 2 cm proximal to the wrist crease under direct vision. The canal was explored.  Motor branch was noted to enter into muscle on the radial side.  The wound was copiously irrigated with saline and the skin closed with interrupted 4-0 Vicryl Rapide sutures.  Sterile compressive dressing was then applied after injection of the area with a local Marcaine with 0.25% without epinephrine, 5 mL was used.  On deflation of the tourniquet, all fingers immediately pinked.  She was taken to the recovery room for observation in satisfactory condition.  She has pain medicine at home.  She will return to the office in 1 week.  ______________________________ Cindee Salt, M.D.     GK/MEDQ  D:  10/29/2012  T:  10/29/2012  Job:  [id number]

## 2012-10-29 NOTE — Brief Op Note (Signed)
10/29/2012  9:12 AM  PATIENT:  Tanya Gonzalez  37 y.o. female  PRE-OPERATIVE DIAGNOSIS:  RIGHT CARPAL TUNNEL SYNDROME  POST-OPERATIVE DIAGNOSIS:  Right Carpal tunnel syndrome  PROCEDURE:  Procedure(s): CARPAL TUNNEL RELEASE (Right)  SURGEON:  Surgeon(s) and Role:    * Nicki Reaper, MD - Primary  PHYSICIAN ASSISTANT:   ASSISTANTS: none   ANESTHESIA:   local and regional  EBL:  Total I/O In: 700 [I.V.:700] Out: -   BLOOD ADMINISTERED:none  DRAINS: none   LOCAL MEDICATIONS USED:  MARCAINE     SPECIMEN:  No Specimen  DISPOSITION OF SPECIMEN:  N/A  COUNTS:  YES  TOURNIQUET:   Total Tourniquet Time Documented: Forearm (Right) - 20 minutes Total: Forearm (Right) - 20 minutes   DICTATION: .Other Dictation: Dictation Number [id number]  PLAN OF CARE: Discharge to home after PACU  PATIENT DISPOSITION:  PACU - hemodynamically stable.

## 2012-10-29 NOTE — Op Note (Signed)
NAME:  Tanya Gonzalez, Tanya Gonzalez                 ACCOUNT NO.:  [id number]  MEDICAL RECORD NO.:  [id number]  LOCATION:  RAD                          FACILITY:  [facility]  PHYSICIAN:  Santiago Stenzel, M.D.       DATE OF BIRTH:  10/24/1975  DATE OF PROCEDURE:  10/29/2012 DATE OF DISCHARGE:  10/30/2011                              OPERATIVE REPORT   PREOPERATIVE DIAGNOSIS:  Carpal tunnel syndrome, right hand.  POSTOPERATIVE DIAGNOSIS:  Carpal tunnel syndrome, right hand.  OPERATION:  Decompression right median nerve.  SURGEON:  Subrina Vecchiarelli, MD  ANESTHESIA:  Forearm-based IV regional, local infiltration.  ANESTHESIOLOGIST:  Dr. Kasik.  HISTORY:  The patient is a 36-year-old female with a history of carpal tunnel syndrome, nerve conductions positive, nonresponsive to conservative treatment.  She has elected to undergo surgical decompression of the right median nerve.  Pre, peri, and postoperative course have been discussed along with risks and complications.  She is aware that there was no guarantee with the surgery; possibility of infection; recurrence of injury to arteries, nerves, tendons, incomplete relief of symptoms, dystrophy.  In the preoperative area, the patient is seen, the extremity marked by both the patient and surgeon.  Antibiotic given.  PROCEDURE IN DETAIL:  The patient was brought to the operating room where a forearm-based IV regional anesthetic was carried out without difficulty.  She was prepped using ChloraPrep, supine position, right arm free.  A 3-minute dry time was allowed.  Time-out taken, confirming the patient and procedure.  A longitudinal incision was made in the right palm carried down through the subcutaneous tissue.  A large nerve was found to exit through the palmar fascia.  This was preserved.  The palmar fascia was split.  This was found to come off of the ulnar nerve penetrating up through the skin unfortunately to the radial side of the incision where it appeared  to terminate.  This was preserved throughout the procedure.  The flexor tendon in the ring and little finger were identified.  After identification of the superficial palmar arch, the palmar fascia was split.  The flexor retinaculum was then longitudinally incised with both blunt and sharp dissection, just to the radial side of the hamate hook protecting the neurovascular bundles.  Retractors were placed prior to the incision after identifying the flexor tendon to the ring little finger.  A right angle and Sewall retractor were placed between the skin and forearm fascia.  The fascia proximally was released for approximately 2 cm proximal to the wrist crease under direct vision. The canal was explored.  Motor branch was noted to enter into muscle on the radial side.  The wound was copiously irrigated with saline and the skin closed with interrupted 4-0 Vicryl Rapide sutures.  Sterile compressive dressing was then applied after injection of the area with a local Marcaine with 0.25% without epinephrine, 5 mL was used.  On deflation of the tourniquet, all fingers immediately pinked.  She was taken to the recovery room for observation in satisfactory condition.  She has pain medicine at home.  She will return to the office in 1 week.            ______________________________ Landree Fernholz, M.D.     GK/MEDQ  D:  10/29/2012  T:  10/29/2012  Job:  [id number] 

## 2012-10-29 NOTE — Transfer of Care (Signed)
Immediate Anesthesia Transfer of Care Note  Patient: Tanya Gonzalez  Procedure(s) Performed: Procedure(s): CARPAL TUNNEL RELEASE (Right)  Patient Location: PACU  Anesthesia Type:Bier block  Level of Consciousness: awake, oriented and patient cooperative  Airway & Oxygen Therapy: Patient Spontanous Breathing and Patient connected to face mask oxygen  Post-op Assessment: Report given to PACU RN and Post -op Vital signs reviewed and stable  Post vital signs: Reviewed and stable  Complications: No apparent anesthesia complications

## 2012-10-29 NOTE — Anesthesia Postprocedure Evaluation (Signed)
  Anesthesia Post-op Note  Patient: Tanya Gonzalez  Procedure(s) Performed: Procedure(s): CARPAL TUNNEL RELEASE (Right)  Patient Location: PACU  Anesthesia Type:MAC and Bier block  Level of Consciousness: awake and alert   Airway and Oxygen Therapy: Patient Spontanous Breathing  Post-op Pain: mild  Post-op Assessment: Post-op Vital signs reviewed, Patient's Cardiovascular Status Stable, Respiratory Function Stable, Patent Airway, No signs of Nausea or vomiting and Pain level controlled  Post-op Vital Signs: stable  Complications: No apparent anesthesia complications

## 2012-10-30 ENCOUNTER — Encounter (HOSPITAL_BASED_OUTPATIENT_CLINIC_OR_DEPARTMENT_OTHER): Payer: Self-pay | Admitting: Orthopedic Surgery

## 2014-02-27 ENCOUNTER — Emergency Department (HOSPITAL_COMMUNITY): Payer: Medicaid Other

## 2014-02-27 ENCOUNTER — Encounter (HOSPITAL_COMMUNITY): Payer: Self-pay | Admitting: *Deleted

## 2014-02-27 ENCOUNTER — Emergency Department (HOSPITAL_COMMUNITY)
Admission: EM | Admit: 2014-02-27 | Discharge: 2014-02-27 | Disposition: A | Discharge status: Home / Self Care | Payer: Medicaid Other | Attending: Emergency Medicine | Admitting: Emergency Medicine

## 2014-02-27 DIAGNOSIS — E039 Hypothyroidism, unspecified: Secondary | ICD-10-CM | POA: Insufficient documentation

## 2014-02-27 DIAGNOSIS — Y998 Other external cause status: Secondary | ICD-10-CM | POA: Diagnosis not present

## 2014-02-27 DIAGNOSIS — G43909 Migraine, unspecified, not intractable, without status migrainosus: Secondary | ICD-10-CM | POA: Insufficient documentation

## 2014-02-27 DIAGNOSIS — Y929 Unspecified place or not applicable: Secondary | ICD-10-CM | POA: Insufficient documentation

## 2014-02-27 DIAGNOSIS — K219 Gastro-esophageal reflux disease without esophagitis: Secondary | ICD-10-CM | POA: Insufficient documentation

## 2014-02-27 DIAGNOSIS — Z791 Long term (current) use of non-steroidal anti-inflammatories (NSAID): Secondary | ICD-10-CM | POA: Insufficient documentation

## 2014-02-27 DIAGNOSIS — S62002A Unspecified fracture of navicular [scaphoid] bone of left wrist, initial encounter for closed fracture: Secondary | ICD-10-CM | POA: Diagnosis not present

## 2014-02-27 DIAGNOSIS — Z8669 Personal history of other diseases of the nervous system and sense organs: Secondary | ICD-10-CM | POA: Diagnosis not present

## 2014-02-27 DIAGNOSIS — W1839XA Other fall on same level, initial encounter: Secondary | ICD-10-CM | POA: Insufficient documentation

## 2014-02-27 DIAGNOSIS — Y9389 Activity, other specified: Secondary | ICD-10-CM | POA: Insufficient documentation

## 2014-02-27 DIAGNOSIS — Z720 Tobacco use: Secondary | ICD-10-CM | POA: Insufficient documentation

## 2014-02-27 DIAGNOSIS — Z87448 Personal history of other diseases of urinary system: Secondary | ICD-10-CM | POA: Insufficient documentation

## 2014-02-27 DIAGNOSIS — S6992XA Unspecified injury of left wrist, hand and finger(s), initial encounter: Secondary | ICD-10-CM | POA: Diagnosis present

## 2014-02-27 DIAGNOSIS — S0990XA Unspecified injury of head, initial encounter: Secondary | ICD-10-CM | POA: Insufficient documentation

## 2014-02-27 DIAGNOSIS — W19XXXA Unspecified fall, initial encounter: Secondary | ICD-10-CM

## 2014-02-27 MED ORDER — CELECOXIB 100 MG PO CAPS: 100.0000 mg | ORAL_CAPSULE | Freq: Two times a day (BID) | ORAL | Status: DC

## 2014-02-27 MED ORDER — HYDROCODONE-ACETAMINOPHEN 7.5-325 MG PO TABS: 1.0000 | ORAL_TABLET | ORAL | Status: DC | PRN

## 2014-02-27 MED ORDER — KETOROLAC TROMETHAMINE 10 MG PO TABS
10.0000 mg | ORAL_TABLET | Freq: Once | ORAL | Status: AC
  Administered 2014-02-27: 10 mg via ORAL
  Filled 2014-02-27: qty 1

## 2014-02-27 MED ORDER — ACETAMINOPHEN 325 MG PO TABS
650.0000 mg | ORAL_TABLET | Freq: Once | ORAL | Status: AC
  Administered 2014-02-27: 650 mg via ORAL
  Filled 2014-02-27: qty 2

## 2014-02-27 NOTE — ED Notes (Signed)
Fall today, pain lt wrist, good radial pulse and cap refill,  Ice pack in place and elevated.

## 2014-02-27 NOTE — Discharge Instructions (Signed)
You have a fracture of a small bone behind your thumb call the scaphoid. Please keep your arm elevated, and apply ice, and usually her splint until seen by Dr. Merlyn LotKuzma. Please use Celebrex 2 times daily with food. Use Norco for pain if needed. Please take this medication with food, and use with caution as it may cause drowsiness. Scaphoid Fracture, Wrist A fracture is a break in the bone. The bone you have broken often does not show up as a fracture on x-ray until later on in the healing phase. This bone is called the scaphoid bone. With this bone, your caregiver will often cast or splint your wrist as though it is fractured, even if a fracture is not seen on the x-ray. This is often done with wrist injuries in which there is tenderness at the base of the thumb. An x-ray at 1-3 weeks after your injury may confirm this fracture. A cast or splint is used to protect and keep your injured bone in good position for healing. The cast or splint will be on generally for about 6 to 16 weeks, depending on your health, age, the fracture location and how quickly you heal. Another name for the scaphoid bone is the navicular bone. HOME CARE INSTRUCTIONS   To lessen the swelling and pain, keep the injured part elevated above your heart while sitting or lying down.  Apply ice to the injury for 15-20 minutes, 03-04 times per day while awake, for 2 days. Put the ice in a plastic bag and place a thin towel between the bag of ice and your cast.  If you have a plaster or fiberglass cast or splint:  Do not try to scratch the skin under the cast using sharp or pointed objects.  Check the skin around the cast every day. You may put lotion on any red or sore areas.  Keep your cast or splint dry and clean.  If you have a plaster splint:  Wear the splint as directed.  You may loosen the elastic bandage around the splint if your fingers become numb, tingle, or turn cold or blue.  If you have been put in a removable splint,  wear and use as directed.  Do not put pressure on any part of your cast or splint; it may deform or break. Rest your cast or splint only on a pillow the first 24 hours until it is fully hardened.  Your cast or splint can be protected during bathing with a plastic bag. Do not lower the cast or splint into water.  Only take over-the-counter or prescription medicines for pain, discomfort, or fever as directed by your caregiver.  If your caregiver has given you a follow up appointment, it is very important to keep that appointment. Not keeping the appointment could result in chronic pain and decreased function. If there is any problem keeping the appointment, you must call back to this facility for assistance. SEEK IMMEDIATE MEDICAL CARE IF:   Your cast gets damaged, wet or breaks.  You have continued severe pain or more swelling than you did before the cast or splint was put on.  Your skin or nails below the injury turn blue or gray, or feel cold or numb.  You have tingling or burning pain in your fingers or increasing pain with movement of your fingers Document Released: 01/20/2002 Document Revised: 04/24/2011 Document Reviewed: 09/18/2008 [facility] Patient Information 2015 [employer]. This information is not intended to replace advice given to you by your  health care provider. Make sure you discuss any questions you have with your health care provider. ° °

## 2014-02-27 NOTE — ED Notes (Signed)
Pt fell today, co pain to lt wrist. CMS intact, pulses easily detected.

## 2014-02-27 NOTE — ED Provider Notes (Signed)
CSN: [id number]     Arrival date & time 02/27/14  1540 History   First MD Initiated Contact with Patient 02/27/14 1618     Chief Complaint  Patient presents with  . Fall     (Consider location/radiation/quality/duration/timing/severity/associated sxs/prior Treatment) Patient is a 39 y.o. female presenting with wrist pain. The history is provided by the patient.  Wrist Pain This is a new problem. The current episode started today. The problem occurs constantly. The problem has been unchanged. Associated symptoms include headaches. Pertinent negatives include no abdominal pain, arthralgias, chest pain, coughing, neck pain, numbness or weakness. Exacerbated by: flex and extending the left wrist or thumb. She has tried nothing for the symptoms. The treatment provided no relief.    Past Medical History  Diagnosis Date  . IC (interstitial cystitis)   . Fibromyalgia   . Hypothyroidism   . IBS (irritable bowel syndrome)   . Migraines   . GERD (gastroesophageal reflux disease)   . Carpal tunnel syndrome of right wrist 10/2012  . Self-catheterizes urinary bladder     prn   Past Surgical History  Procedure Laterality Date  . Cholecystectomy    . Cystoscopy  01/27/2010    with bladder hydrodistention  . Esophagogastroduodenoscopy  01/2010  . Colonoscopy  11/09/2011       . Vaginal hysterectomy  10/24/2001  . Salpingoophorectomy Left 10/24/2001  . Laparoscopic salpingoopherectomy Right 01/22/2003  . Carpal tunnel release Right 10/29/2012    Procedure: CARPAL TUNNEL RELEASE;  Surgeon: Nicki Reaper, MD;  Location: [facility];  Service: Orthopedics;  Laterality: Right;   Family History  Problem Relation Age of Onset  . Crohn's disease Maternal Grandmother   . Cervical cancer Maternal Aunt   . Ovarian cancer Maternal Aunt    History  Substance Use Topics  . Smoking status: Current Every Day Smoker -- 0.50 packs/day for 15 years    Types: Cigarettes  . Smokeless tobacco:  Never Used  . Alcohol Use: Yes     Comment: rarely   OB History    No data available     Review of Systems  Constitutional: Negative for activity change.       All ROS Neg except as noted in HPI  Eyes: Negative for photophobia and discharge.  Respiratory: Negative for cough, shortness of breath and wheezing.   Cardiovascular: Negative for chest pain and palpitations.  Gastrointestinal: Negative for abdominal pain and blood in stool.  Genitourinary: Negative for dysuria, frequency and hematuria.  Musculoskeletal: Negative for back pain, arthralgias and neck pain.  Skin: Negative.   Neurological: Positive for headaches. Negative for dizziness, seizures, speech difficulty, weakness and numbness.  Psychiatric/Behavioral: Negative for hallucinations and confusion.      Allergies  Ciprofloxacin and Codeine  Home Medications   Prior to Admission medications   Medication Sig Start Date End Date Taking? Authorizing Provider  diazepam (VALIUM) 5 MG tablet Take 5 mg by mouth at bedtime.    Historical Provider, MD  diclofenac sodium (VOLTAREN) 1 % GEL Apply 2 g topically 2 (two) times daily as needed. For leg pain    Historical Provider, MD  HYDROcodone-acetaminophen (NORCO) 7.5-325 MG per tablet Take 1 tablet by mouth every 6 (six) hours as needed for pain.    Historical Provider, MD  lansoprazole (PREVACID) 15 MG capsule Take 15 mg by mouth daily.    Historical Provider, MD  levothyroxine (SYNTHROID, LEVOTHROID) 50 MCG tablet Take 50 mcg by mouth daily before breakfast.  Historical Provider, MD  Linaclotide Karlene Einstein(LINZESS) 290 MCG CAPS Take 1 capsule by mouth daily before breakfast. 10/23/11   Tiffany KocherLeslie S Lewis, [name]  NONFORMULARY OR COMPOUNDED ITEM Take 1 capsule by mouth daily. Compounded capsule with elmiron 100 mg, lidocaine 10 mg, and sodium bicarb 5 mg)    Historical Provider, MD  pentosan polysulfate (ELMIRON) 100 MG capsule Take 100 mg by mouth 3 (three) times daily before meals.     Historical Provider, MD   BP 126/83 mmHg  Pulse 73  Temp(Src) 99.2 F (37.3 C) (Oral)  Resp 18  Ht 5\' 2"  (1.575 m)  Wt 126 lb (57.153 kg)  BMI 23.04 kg/m2 Physical Exam  Constitutional: She is oriented to person, place, and time. She appears well-developed and well-nourished.  Non-toxic appearance.  HENT:  Head: Normocephalic.  Right Ear: Tympanic membrane and external ear normal.  Left Ear: Tympanic membrane and external ear normal.  Eyes: EOM and lids are normal. Pupils are equal, round, and reactive to light.  Neck: Normal range of motion. Neck supple. Carotid bruit is not present.  Cardiovascular: Normal rate, regular rhythm, normal heart sounds, intact distal pulses and normal pulses.   Pulmonary/Chest: Breath sounds normal. No respiratory distress.  Abdominal: Soft. Bowel sounds are normal. There is no tenderness. There is no guarding.  Musculoskeletal: Normal range of motion.  Lymphadenopathy:       Head (right side): No submandibular adenopathy present.       Head (left side): No submandibular adenopathy present.    She has no cervical adenopathy.  Neurological: She is alert and oriented to person, place, and time. She has normal strength. No cranial nerve deficit or sensory deficit.  Skin: Skin is warm and dry.  Psychiatric: She has a normal mood and affect. Her speech is normal.  Nursing note and vitals reviewed.   ED Course  FRACTURE CARE LEFT WRIST - patient identified by arm band. Permission for the procedure given by the patient. Explained to the patient and demonstrated with pictures the fracture involving the scaphoid bone of the left wrist. Discussed and described the procedure of splinting and applying the sling.  Splint applied and sling applied without problem. After the splint and sling applied, the patient has capillary refill of less than 2 seconds with good temperature involving the left upper extremity.Pt c/o of this splint feeling uncomfortable. Plaster  thumb spica applied.  The patient will be provided prescription for Celebrex and Norco 7.5 mg. The patient will follow-up with Dr. Merlyn LotKuzma (hand surgery).   Procedures (including critical care time) Labs Review Labs Reviewed - No data to display  Imaging Review No results found.   EKG Interpretation None      MDM  Patient sustained a fall from a porch today and injured the left wrist. X-ray reveals a scaphoid fracture on the left. The patient is fitted with a  thumb spica splint . Patient also fitted with a sling. She has seen Dr. Merlyn LotKuzma in the past and request to see him again for this injury. Prescription for Celebrex and Norco 7.5 mg given to the patient.    Final diagnoses:  Fall    **I have reviewed nursing notes, vital signs, and all appropriate lab and imaging results for this patient.    Kathie DikeHobson M Raylyn Speckman, [name] 02/27/14 1757  Kathie DikeHobson M Sanjuan Sawa, [name] 02/27/14 2116  Vanetta MuldersScott Zackowski, MD 02/28/14 [phone]

## 2014-06-24 ENCOUNTER — Encounter (HOSPITAL_COMMUNITY): Payer: Self-pay

## 2014-06-24 ENCOUNTER — Ambulatory Visit (HOSPITAL_COMMUNITY): Payer: Medicaid Other | Attending: Orthopedic Surgery

## 2014-06-24 DIAGNOSIS — M25532 Pain in left wrist: Secondary | ICD-10-CM

## 2014-06-24 DIAGNOSIS — S62002D Unspecified fracture of navicular [scaphoid] bone of left wrist, subsequent encounter for fracture with routine healing: Secondary | ICD-10-CM | POA: Insufficient documentation

## 2014-06-24 DIAGNOSIS — R29898 Other symptoms and signs involving the musculoskeletal system: Secondary | ICD-10-CM | POA: Diagnosis not present

## 2014-06-24 DIAGNOSIS — M629 Disorder of muscle, unspecified: Secondary | ICD-10-CM | POA: Diagnosis not present

## 2014-06-24 DIAGNOSIS — M6289 Other specified disorders of muscle: Secondary | ICD-10-CM

## 2014-06-24 DIAGNOSIS — M25639 Stiffness of unspecified wrist, not elsewhere classified: Secondary | ICD-10-CM

## 2014-06-24 NOTE — Therapy (Signed)
[facility] [address] [place], [place], [address] Phone: [phone]   Fax:  [phone]  Occupational Therapy Evaluation  Patient Details  Name: Tanya HumanStacey B Stegmann MRN: [id number] Date of Birth: 09-Feb-1976 Referring Provider:  Cindee SaltKuzma, Gary, MD  Encounter Date: 06/24/2014      OT End of Session - 06/24/14 1602    Visit Number 1   Number of Visits 1   Authorization Type Medicaid   Authorization Time Period 1 eval covered only   Authorization - Visit Number 1   Authorization - Number of Visits 1   OT Start Time 1515   OT Stop Time 1600   OT Time Calculation (min) 45 min   Activity Tolerance Patient tolerated treatment well   Behavior During Therapy [facility] for tasks assessed/performed      Past Medical History  Diagnosis Date  . IC (interstitial cystitis)   . Fibromyalgia   . Hypothyroidism   . IBS (irritable bowel syndrome)   . Migraines   . GERD (gastroesophageal reflux disease)   . Carpal tunnel syndrome of right wrist 10/2012  . Self-catheterizes urinary bladder     prn    Past Surgical History  Procedure Laterality Date  . Cholecystectomy    . Cystoscopy  01/27/2010    with bladder hydrodistention  . Esophagogastroduodenoscopy  01/2010  . Colonoscopy  11/09/2011       . Vaginal hysterectomy  10/24/2001  . Salpingoophorectomy Left 10/24/2001  . Laparoscopic salpingoopherectomy Right 01/22/2003  . Carpal tunnel release Right 10/29/2012    Procedure: CARPAL TUNNEL RELEASE;  Surgeon: Nicki ReaperGary R Kuzma, MD;  Location: [facility];  Service: Orthopedics;  Laterality: Right;    There were no vitals filed for this visit.  Visit Diagnosis:  Scaphoid fracture of wrist, left, with routine healing, subsequent encounter - Plan: Ot plan of care cert/re-cert  Decreased range of motion of wrist - Plan: Ot plan of care cert/re-cert  Pain in wrist joint, left - Plan: Ot plan of care cert/re-cert  Tight fascia - Plan: Ot plan  of care cert/re-cert      Subjective Assessment - 06/24/14 1600    Subjective  S: I just don't have the range of motion in my left that my right has.    Patient is accompained by: Family member   Pertinent History Patient is a 39 y/o female s/p left scaphoid fx with routine healing. Patient fell on 02/27/14 when she was on her porch. Patient was placed in a splint until 05/11/14. Today patient presents with no restrictions. Dr. Merlyn LotKuzma has referred patient to occupational therapy for evaluation and treatment.    Patient Stated Goals To bend wrist in full range of motion.   Currently in Pain? Yes   Pain Score 3    Pain Location Wrist   Pain Orientation Left   Pain Descriptors / Indicators Sore   Pain Type Acute pain   Pain Onset More than a month ago   Pain Frequency Intermittent           [facility] OT Assessment - 06/24/14 1525    Assessment   Diagnosis left scaphoid fx   Onset Date --  January 2016   Prior Therapy None   Precautions   Precautions None   Restrictions   Weight Bearing Restrictions No   Balance Screen   Has the patient fallen in the past 6 months Yes   How many times? 1   Has the patient had  a decrease in activity level because of a fear of falling?  No   Is the patient reluctant to leave their home because of a fear of falling?  No   Home  Environment   Family/patient expects to be discharged to: Private residence   Prior Function   Level of Independence Independent with basic ADLs;Independent with gait   Mobility   Mobility Status Independent   Written Expression   Dominant Hand Right   Cognition   Overall Cognitive Status Within Functional Limits for tasks assessed   ROM / Strength   AROM / PROM / Strength AROM;PROM;Strength   Palpation   Palpation Min fascial restrctions on volar side of distal forearm.    AROM   Overall AROM Comments Assessed seated.    AROM Assessment Site Wrist   Right/Left Wrist Left   Left Wrist Extension 42 Degrees   Left Wrist  Flexion 58 Degrees   Left Wrist Radial Deviation 20 Degrees   Left Wrist Ulnar Deviation 28 Degrees   Strength   Overall Strength Comments Assessed seated.   Strength Assessment Site Wrist;Hand   Right/Left Wrist Left   Left Wrist Flexion 4+/5   Left Wrist Extension 4-/5   Right/Left hand Left   Left Hand Gross Grasp Functional                         OT Education - 06/24/14 1559    Education provided Yes   Education Details Wrist stretches   Person(s) Educated Patient   Methods Explanation;Demonstration;Handout   Comprehension Verbalized understanding;Returned demonstration          OT Short Term Goals - 06/24/14 1603    OT SHORT TERM GOAL #1   Title Patient will be educated and independent with HEP.   Time 1   Period Days   Status Achieved                  Plan - 06/24/14 1602    Clinical Impression Statement A: Patient is a 39 y/o female s/p left healed scaphoid fx causing decreased ROM and increased pain and fascial restrctions resulting in difficulty completing daily tasks. Pt was given an HEP and educated on each exercise. Recommended using ice for pain and heat to decrease muscle tightness as needed. Patient was instructed how to perform a self massage to wrist.     Pt will benefit from skilled therapeutic intervention in order to improve on the following deficits (Retired) Decreased range of motion;Pain;Increased fascial restricitons   Rehab Potential Excellent   OT Frequency 1x / week   OT Duration --  1 week   OT Treatment/Interventions Patient/family education   Plan P: 1 time evaluation with HEP.    Consulted and Agree with Plan of Care Patient        Problem List Patient Active Problem List   Diagnosis Date Noted  . Rectal bleeding 10/23/2011  . Constipation 10/23/2011  . RLQ abdominal pain 10/23/2011  . Hemorrhoids 10/23/2011  . IBS (irritable bowel syndrome) 06/22/2010  . Epigastric pain 06/22/2010  . Chronic nausea  06/22/2010  . GERD 01/11/2010  . OTHER DYSPHAGIA 01/11/2010    Tanya Gonzalez, OTR/L,CBIS  [phone]  06/24/2014, 4:12 PM  [facility] [address] [place], [place], [address] Phone: [phone]   Fax:  [phone]

## 2014-06-24 NOTE — Patient Instructions (Signed)
*  Start with heat on palm side of wrist for 15-20 minutes.  *Perform a self massage on tight areas of wrist for 5-10 minutes.    WRIST EXTENSOR STRETCH  Use your unaffected hand to bend the affected wrist down as shown.   Keep the elbow straight on the affected side the entire time.     WRIST FLEXOR STRETCH  Use your unaffected hand to bend the affected wrist up as shown.   Keep the elbow straight on the affected side the entire time.     PRAYER STRETCH - WRIST  Place the palms of your hands together to stretch the wrist as shown.     Wrist Flexor Stretch  Gently pull wrist into extension with elbow extended, while keeping shoulder down.       Wrist Flexor stretch with supination   There are two different positions shown here in these two pics.    One also adds shoulder extension which will increase the stretch to the biceps and anterior deltoid/pec major as well.  Notice the angle of the elbow being as straight as possible, wrist pulled back and fingers turned towards the midline of your body.       Reverse Prayer Stretch  Place the backs of your hands together.  Stretch the wrist by lowering the elbows as far as you can still keeping the backs of the hands together    WRIST EXTENSION STRETCH - TABLE  Place boths hand on a table as shown and gently lean forward until a stretch is felt.    COMPLETE ALL EXERCISES: 3 REPS HOLD FOR 10 SECONDS EACH.

## 2015-12-29 IMAGING — CR DG WRIST COMPLETE 3+V*L*
4 series · 4 of 4 positions shown · non-contrast
Comparison: None.

CLINICAL DATA: Acute left wrist pain after falling off porch.

EXAM:
LEFT WRIST - COMPLETE 3+ VIEW

[view not recorded (1 of 4)]
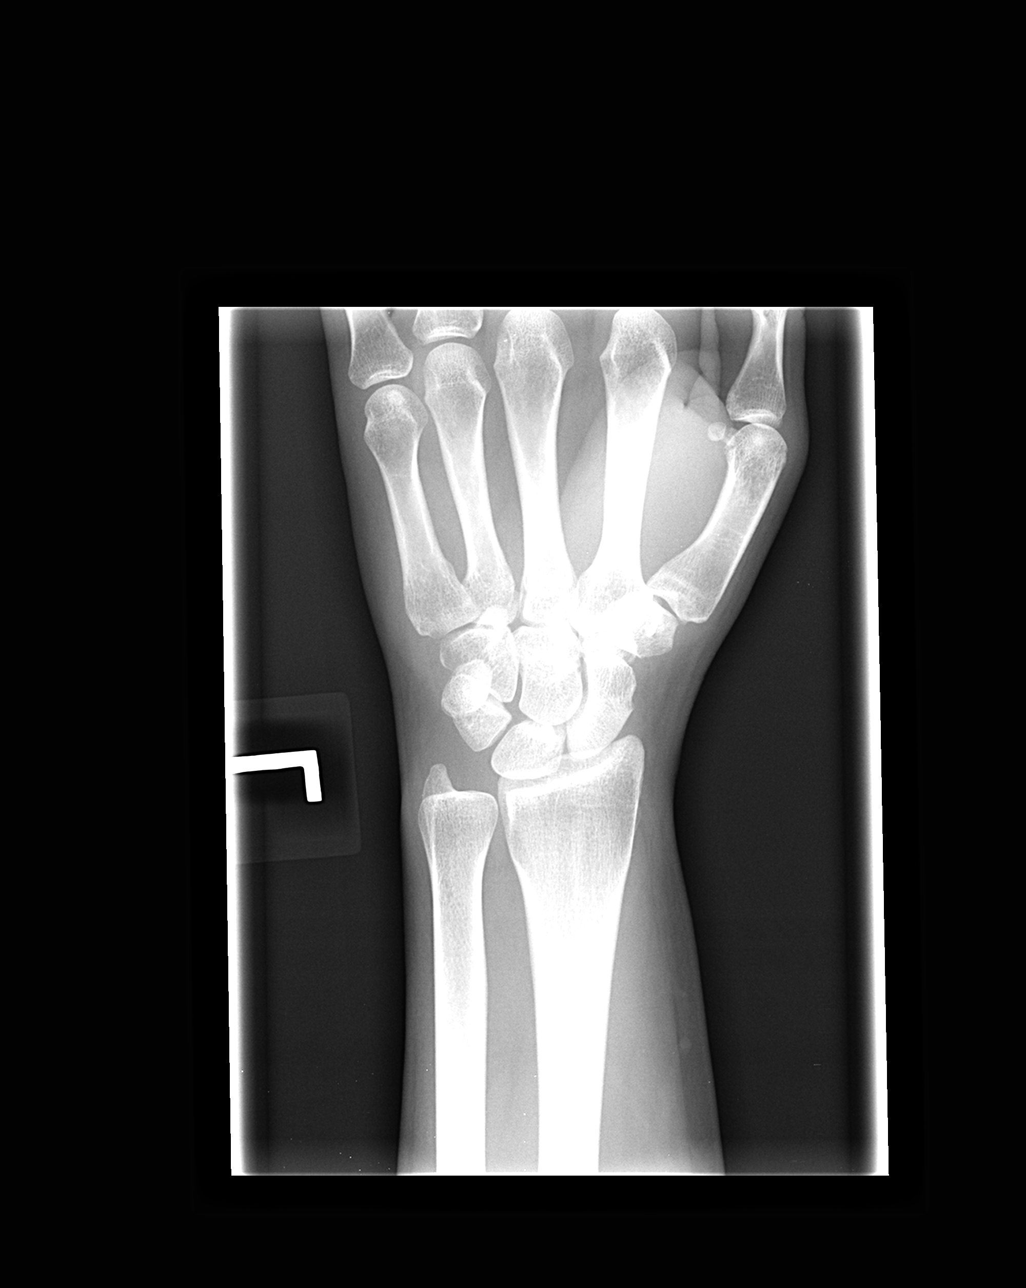

[view not recorded (2 of 4)]
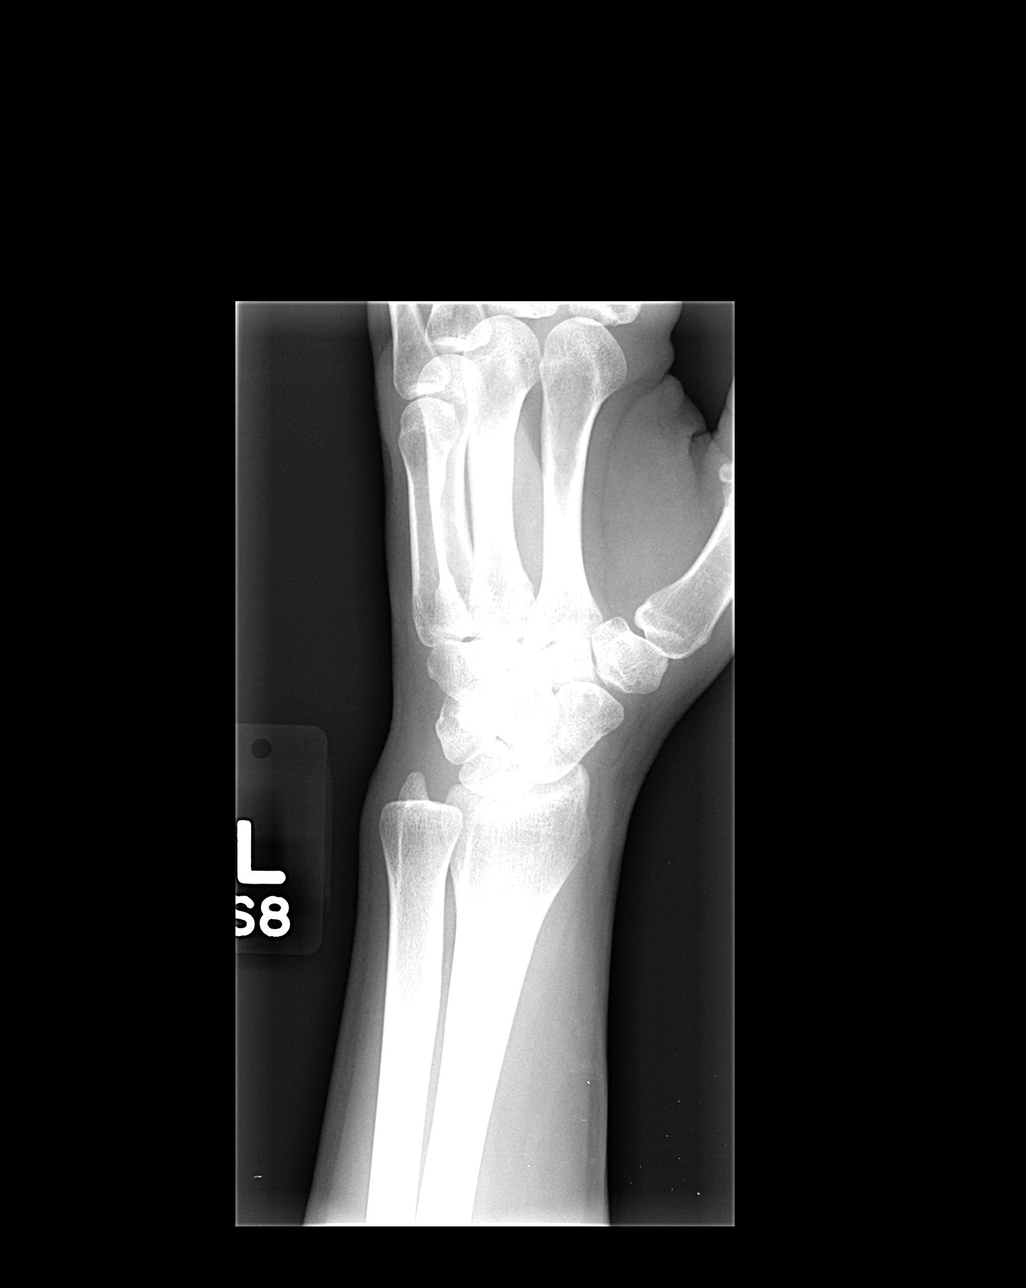

[view not recorded (3 of 4)]
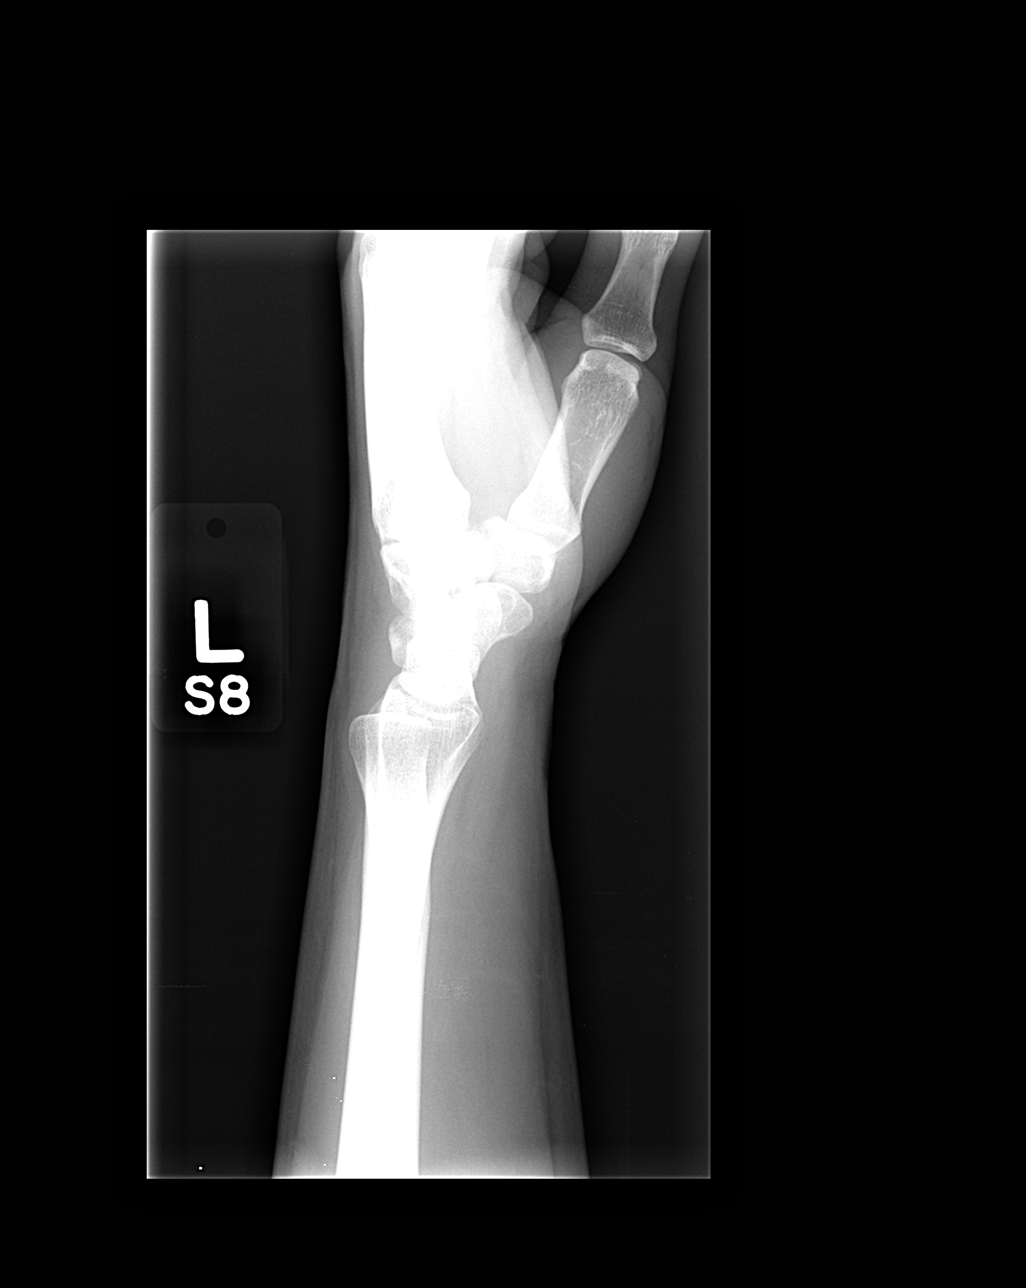

[view not recorded (4 of 4)]
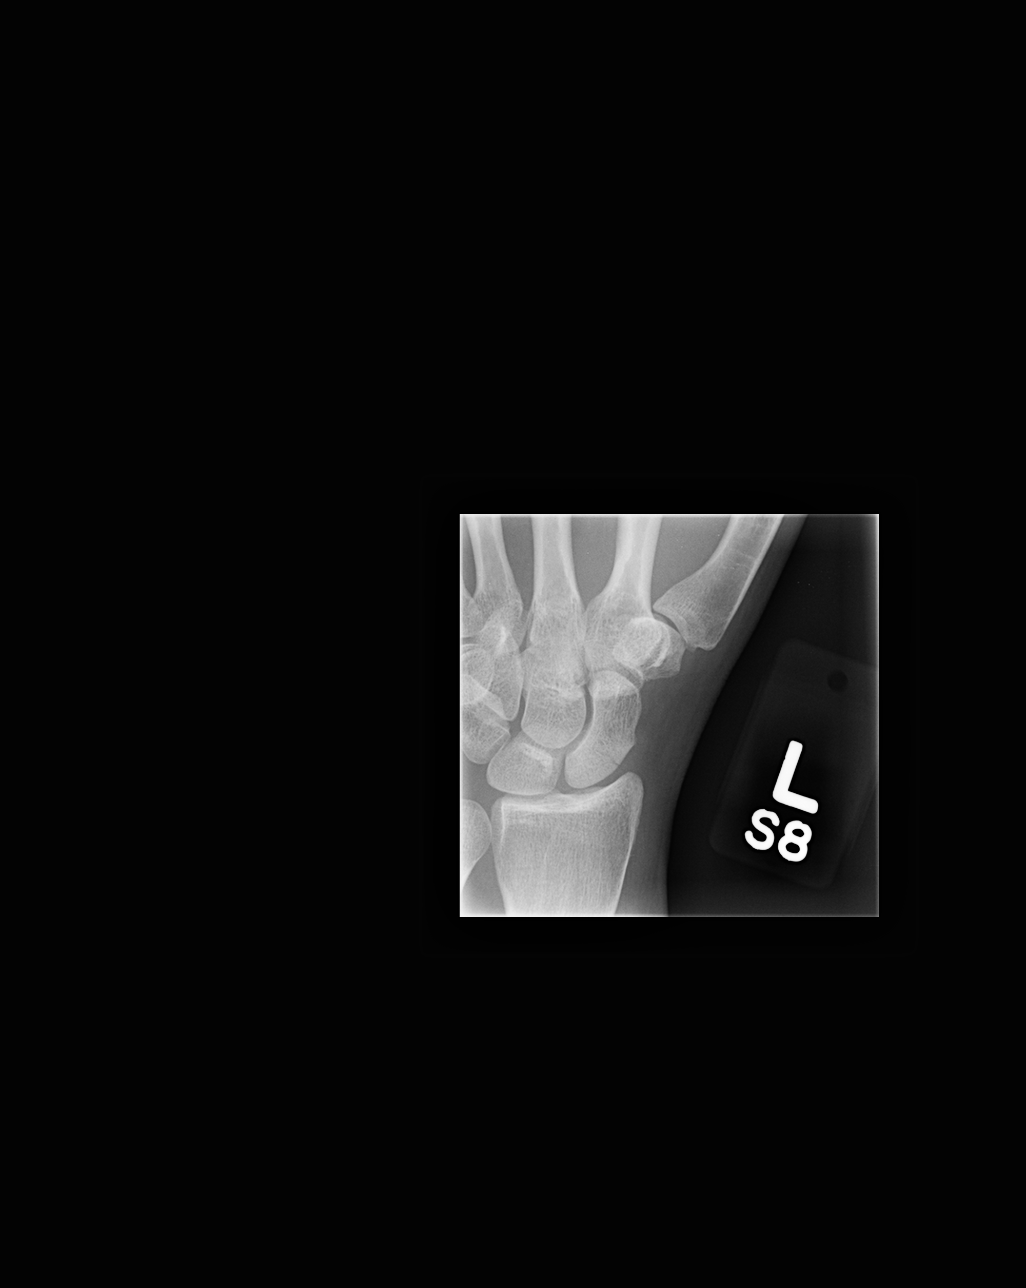

[4 of 4 positions shown; findings below may reference images not displayed]

FINDINGS: Probable nondisplaced fracture is seen involving the proximal
scaphoid on the navicular view. No other fracture or other bony
abnormality is noted. No soft tissue abnormality or radiopaque
foreign body is noted.
IMPRESSION: Probable nondisplaced fracture is seen involving the proximal
portion of the scaphoid.

## 2017-09-27 ENCOUNTER — Emergency Department (HOSPITAL_COMMUNITY): Payer: Self-pay

## 2017-09-27 ENCOUNTER — Other Ambulatory Visit: Payer: Self-pay

## 2017-09-27 ENCOUNTER — Encounter (HOSPITAL_COMMUNITY): Payer: Self-pay | Admitting: Emergency Medicine

## 2017-09-27 ENCOUNTER — Emergency Department (HOSPITAL_COMMUNITY)
Admission: EM | Admit: 2017-09-27 | Discharge: 2017-09-27 | Disposition: A | Discharge status: Home / Self Care | Payer: Self-pay | Attending: Emergency Medicine | Admitting: Emergency Medicine

## 2017-09-27 DIAGNOSIS — E039 Hypothyroidism, unspecified: Secondary | ICD-10-CM | POA: Insufficient documentation

## 2017-09-27 DIAGNOSIS — M5432 Sciatica, left side: Secondary | ICD-10-CM

## 2017-09-27 DIAGNOSIS — F17210 Nicotine dependence, cigarettes, uncomplicated: Secondary | ICD-10-CM | POA: Insufficient documentation

## 2017-09-27 DIAGNOSIS — Z79899 Other long term (current) drug therapy: Secondary | ICD-10-CM | POA: Insufficient documentation

## 2017-09-27 DIAGNOSIS — M5442 Lumbago with sciatica, left side: Secondary | ICD-10-CM | POA: Insufficient documentation

## 2017-09-27 MED ORDER — PREDNISONE 10 MG PO TABS: ORAL_TABLET | ORAL | 0 refills | Status: DC

## 2017-09-27 MED ORDER — METHOCARBAMOL 500 MG PO TABS
750.0000 mg | ORAL_TABLET | Freq: Once | ORAL | Status: AC
  Administered 2017-09-27: 750 mg via ORAL
  Filled 2017-09-27: qty 2

## 2017-09-27 MED ORDER — METHOCARBAMOL 750 MG PO TABS: 750.0000 mg | ORAL_TABLET | Freq: Four times a day (QID) | ORAL | 0 refills | Status: DC

## 2017-09-27 MED ORDER — HYDROCODONE-ACETAMINOPHEN 5-325 MG PO TABS
1.0000 | ORAL_TABLET | Freq: Once | ORAL | Status: AC
  Administered 2017-09-27: 1 via ORAL
  Filled 2017-09-27: qty 1

## 2017-09-27 MED ORDER — PREDNISONE 50 MG PO TABS
60.0000 mg | ORAL_TABLET | Freq: Once | ORAL | Status: AC
  Administered 2017-09-27: 16:00:00 60 mg via ORAL
  Filled 2017-09-27: qty 1

## 2017-09-27 MED ORDER — HYDROCODONE-ACETAMINOPHEN 5-325 MG PO TABS: 1.0000 | ORAL_TABLET | ORAL | 0 refills | Status: DC | PRN

## 2017-09-27 NOTE — Discharge Instructions (Addendum)
Take your next dose of prednisone tomorrow evening.  Use the the other medicines as directed.  Do not drive within 4 hours of taking hydrocodone as this will make you drowsy.  Avoid lifting,  Bending,  Twisting or any other activity that worsens your pain over the next week.  Apply a heating pad to your lower back for 10-15 minutes several times daily.  You should get rechecked if your symptoms are not better over the next 5 days,  Or you develop increased pain,  Weakness in your leg(s) or loss of bladder or bowel function - these are symptoms of a worse injury.

## 2017-09-27 NOTE — ED Notes (Signed)
Pt drove herself.

## 2017-09-27 NOTE — ED Triage Notes (Signed)
Back pain onset 2 days ago, worse with standing or side on left side. Pt works on concrete floors, denies any injuries.

## 2017-09-27 NOTE — ED Provider Notes (Signed)
[facility] EMERGENCY DEPARTMENT Provider Note   CSN: [id number] Arrival date & time: 09/27/17  1434     History   Chief Complaint Chief Complaint  Patient presents with  . Back Pain    HPI Tanya Gonzalez is a 42 y.o. female presenting with acuteronic low back pain which has which has been present for the past 2 days. She works in [employer], describing frequently having to lift up to 40 lb boxes, and woke with pain after significant lifting the day before.   Patient denies any new injury specifically.  There is radiation of pain into the left buttock.  There has been no weakness or numbness in the lower extremities and no urinary or bowel retention or incontinence. Also denies need to self cath (due to IC in at least 5 years). Patient does not have a history of cancer or IVDU.  The patient has had no treatment prior to arrival. Pain is improved with certain positions, especially sitting on right hip with right flexion of torso.    The history is provided by the patient.    Past Medical History:  Diagnosis Date  . Carpal tunnel syndrome of right wrist 10/2012  . Fibromyalgia   . GERD (gastroesophageal reflux disease)   . Hypothyroidism   . IBS (irritable bowel syndrome)   . IC (interstitial cystitis)   . Migraines   . Self-catheterizes urinary bladder    prn    Patient Active Problem List   Diagnosis Date Noted  . Rectal bleeding 10/23/2011  . Constipation 10/23/2011  . RLQ abdominal pain 10/23/2011  . Hemorrhoids 10/23/2011  . IBS (irritable bowel syndrome) 06/22/2010  . Epigastric pain 06/22/2010  . Chronic nausea 06/22/2010  . GERD 01/11/2010  . OTHER DYSPHAGIA 01/11/2010    Past Surgical History:  Procedure Laterality Date  . CARPAL TUNNEL RELEASE Right 10/29/2012   Procedure: CARPAL TUNNEL RELEASE;  Surgeon: Nicki ReaperGary R Kuzma, MD;  Location: [facility];  Service: Orthopedics;  Laterality: Right;  . CHOLECYSTECTOMY    . COLONOSCOPY  11/09/2011      .  CYSTOSCOPY  01/27/2010   with bladder hydrodistention  . ESOPHAGOGASTRODUODENOSCOPY  01/2010  . LAPAROSCOPIC SALPINGOOPHERECTOMY Right 01/22/2003  . SALPINGOOPHORECTOMY Left 10/24/2001  . VAGINAL HYSTERECTOMY  10/24/2001     OB History   None      Home Medications    Prior to Admission medications   Medication Sig Start Date End Date Taking? Authorizing Provider  celecoxib (CELEBREX) 100 MG capsule Take 1 capsule (100 mg total) by mouth 2 (two) times daily. Patient not taking: Reported on 06/24/2014 02/27/14   Ivery QualeBryant, Hobson, [name]  diazepam (VALIUM) 5 MG tablet Take 5 mg by mouth at bedtime.    [provider]  diclofenac sodium (VOLTAREN) 1 % GEL Apply 2 g topically 2 (two) times daily as needed. For leg pain    [provider]  HYDROcodone-acetaminophen (NORCO/VICODIN) 5-325 MG tablet Take 1 tablet by mouth every 4 (four) hours as needed. 09/27/17   Burgess AmorIdol, Karon Cotterill, [name]  levothyroxine (SYNTHROID, LEVOTHROID) 100 MCG tablet Take 100 mcg by mouth daily before breakfast.    [provider]  Linaclotide Karlene Einstein(LINZESS) 290 MCG CAPS Take 1 capsule by mouth daily before breakfast. 10/23/11   Tiffany KocherLewis, Leslie S, [name]  methocarbamol (ROBAXIN-750) 750 MG tablet Take 1 tablet (750 mg total) by mouth 4 (four) times daily. 09/27/17   Burgess AmorIdol, Addalyn Speedy, [name]  NONFORMULARY OR COMPOUNDED ITEM Take 1 capsule by mouth  daily. Compounded capsule with elmiron 100 mg, lidocaine 10 mg, and sodium bicarb 5 mg)    [provider]  predniSONE (DELTASONE) 10 MG tablet Take 6 tablets day one, 5 tablets day two, 4 tablets day three, 3 tablets day four, 2 tablets day five, then 1 tablet day six 09/27/17   Burgess AmorIdol, Joeann Steppe, [name]    Family History Family History  Problem Relation Age of Onset  . Crohn's disease Maternal Grandmother   . Cervical cancer Maternal Aunt   . Ovarian cancer Maternal Aunt     Social History Social History   Tobacco Use  . Smoking status: Current Every Day Smoker     Packs/day: 0.50    Years: 15.00    Pack years: 7.50    Types: Cigarettes  . Smokeless tobacco: Never Used  Substance Use Topics  . Alcohol use: Yes    Comment: rarely  . Drug use: Yes    Types: Marijuana     Allergies   Ciprofloxacin and Codeine   Review of Systems Review of Systems  Constitutional: Negative for fever.  Respiratory: Negative for shortness of breath.   Cardiovascular: Negative for chest pain and leg swelling.  Gastrointestinal: Negative for abdominal distention, abdominal pain and constipation.  Genitourinary: Negative for difficulty urinating, dysuria, flank pain, frequency, hematuria and urgency.  Musculoskeletal: Positive for back pain. Negative for gait problem and joint swelling.  Skin: Negative for rash.  Neurological: Negative for weakness and numbness.     Physical Exam Updated Vital Signs BP 120/87 (BP Location: Right Arm)   Pulse 91   Temp 98.1 F (36.7 C) (Oral)   Resp 18   Ht 5\' 2"  (1.575 m)   Wt 62.6 kg   SpO2 100%   BMI 25.24 kg/m   Physical Exam  Constitutional: She appears well-developed and well-nourished.  HENT:  Head: Normocephalic.  Eyes: Conjunctivae are normal.  Neck: Normal range of motion. Neck supple.  Cardiovascular: Normal rate and intact distal pulses.  Pedal pulses normal.  Pulmonary/Chest: Effort normal.  Abdominal: Soft. Bowel sounds are normal. She exhibits no distension and no mass.  Musculoskeletal: Normal range of motion. She exhibits no edema.       Lumbar back: She exhibits tenderness. She exhibits no swelling, no edema, no deformity and no spasm.  No ttp midline lumbar spine, ttp left paralumbar and left upper buttock.  Neurological: She is alert. She has normal strength. She displays no atrophy and no tremor. No sensory deficit. Gait normal.  Reflex Scores:      Achilles reflexes are 2+ on the right side and 2+ on the left side. No strength deficit noted in hip and knee flexor and extensor muscle groups.   Ankle flexion and extension intact. Gait normal.  Skin: Skin is warm and dry.  Psychiatric: She has a normal mood and affect.  Nursing note and vitals reviewed.    ED Treatments / Results  Labs (all labs ordered are listed, but only abnormal results are displayed) Labs Reviewed - No data to display  EKG None  Radiology Dg Lumbar Spine Complete  Result Date: 09/27/2017 CLINICAL DATA:  Lumbago with left-sided radicular symptoms EXAM: LUMBAR SPINE - COMPLETE 4+ VIEW COMPARISON:  None. FINDINGS: Frontal, lateral, spot lumbosacral lateral, and bilateral oblique views were obtained. There are 5 non-rib-bearing lumbar type vertebral bodies. There is no fracture or spondylolisthesis. Disc spaces appear unremarkable. There is no appreciable facet arthropathy. Postoperative changes noted in the pelvis. IMPRESSION: No fracture or spondylolisthesis.  No appreciable arthropathy. Electronically Signed   By: Bretta Bang III M.D.   On: 09/27/2017 16:22    Procedures Procedures (including critical care time)  Medications Ordered in ED Medications  methocarbamol (ROBAXIN) tablet 750 mg (750 mg Oral Given 09/27/17 1618)  predniSONE (DELTASONE) tablet 60 mg (60 mg Oral Given 09/27/17 1619)  HYDROcodone-acetaminophen (NORCO/VICODIN) 5-325 MG per tablet 1 tablet (1 tablet Oral Given 09/27/17 1619)     Initial Impression / Assessment and Plan / ED Course  I have reviewed the triage vital signs and the nursing notes.  Pertinent labs & imaging results that were available during my care of the patient were reviewed by me and considered in my medical decision making (see chart for details).     No neuro deficit on exam or by history to suggest emergent or surgical presentation.  Discussed worsened sx that should prompt immediate re-evaluation including distal weakness, bowel/bladder retention/incontinence.        Final Clinical Impressions(s) / ED Diagnoses   Final diagnoses:  Sciatica of  left side    ED Discharge Orders         Ordered    HYDROcodone-acetaminophen (NORCO/VICODIN) 5-325 MG tablet  Every 4 hours PRN     09/27/17 1648    methocarbamol (ROBAXIN-750) 750 MG tablet  4 times daily     09/27/17 1648    predniSONE (DELTASONE) 10 MG tablet     09/27/17 1648           Burgess Amor, [name] 09/27/17 1718    Bethann Berkshire, MD 09/27/17 2319

## 2017-09-27 NOTE — ED Notes (Signed)
Pt with lower back pain since yesterday, pain starts from lower back and radiates to left hip and left buttock.  Has tried tylenol and epsom salt baths and Flexeril from her daughter.  Denies burning on urination.  Pt states she lifts heavy boxes at work.

## 2018-09-20 ENCOUNTER — Other Ambulatory Visit: Payer: Self-pay | Admitting: Internal Medicine

## 2018-09-20 DIAGNOSIS — Z20822 Contact with and (suspected) exposure to covid-19: Secondary | ICD-10-CM

## 2018-09-21 LAB — NOVEL CORONAVIRUS, NAA: SARS-CoV-2, NAA: NOT DETECTED

## 2018-09-27 ENCOUNTER — Telehealth: Payer: Self-pay | Admitting: Internal Medicine

## 2018-09-27 NOTE — Telephone Encounter (Signed)
Negative COVID results given. Patient results "NOT Detected." Caller expressed understanding.   Pt would like to know if results can be sent to her PCP Monico Blitz at [facility]

## 2018-09-27 NOTE — Telephone Encounter (Signed)
Results faxed to patient's PCP per his request.

## 2019-04-05 ENCOUNTER — Other Ambulatory Visit: Payer: Self-pay

## 2019-04-05 ENCOUNTER — Encounter (HOSPITAL_COMMUNITY): Payer: Self-pay | Admitting: Emergency Medicine

## 2019-04-05 ENCOUNTER — Emergency Department (HOSPITAL_COMMUNITY)
Admission: EM | Admit: 2019-04-05 | Discharge: 2019-04-05 | Disposition: A | Discharge status: Home / Self Care | Payer: Self-pay | Attending: Emergency Medicine | Admitting: Emergency Medicine

## 2019-04-05 DIAGNOSIS — R0981 Nasal congestion: Secondary | ICD-10-CM | POA: Insufficient documentation

## 2019-04-05 DIAGNOSIS — R220 Localized swelling, mass and lump, head: Secondary | ICD-10-CM | POA: Insufficient documentation

## 2019-04-05 DIAGNOSIS — E039 Hypothyroidism, unspecified: Secondary | ICD-10-CM | POA: Insufficient documentation

## 2019-04-05 DIAGNOSIS — Z79899 Other long term (current) drug therapy: Secondary | ICD-10-CM | POA: Insufficient documentation

## 2019-04-05 DIAGNOSIS — H1013 Acute atopic conjunctivitis, bilateral: Secondary | ICD-10-CM

## 2019-04-05 DIAGNOSIS — Z20822 Contact with and (suspected) exposure to covid-19: Secondary | ICD-10-CM | POA: Insufficient documentation

## 2019-04-05 DIAGNOSIS — F17210 Nicotine dependence, cigarettes, uncomplicated: Secondary | ICD-10-CM | POA: Insufficient documentation

## 2019-04-05 NOTE — Discharge Instructions (Addendum)
I suspect your symptoms are due to allergic inflammation, please take the following medications to treat your symptoms: Zyrtec (10 mg, 1 tablet) once daily Flonase (2 sprays in each nostril) once daily Ketotifen ([place] eye drops) 1 drop in both eyes twice daily  All of these medications are available over-the-counter at [facility].  You have a Covid test pending and you should quarantine at home until you receive your results, these should be back in about 2 to 3 days, you will receive a phone call if results are positive, you can view negative results online through MyChart.  Please follow the instructions on your paperwork today to set up your MyChart account.

## 2019-04-05 NOTE — ED Notes (Addendum)
Pt with swelling to bilateral eyes yesterday morning and this morning with redness with burning and itching to sides of nose.  Also c/o running nose and feels like she is breathing in ice sickles.

## 2019-04-05 NOTE — ED Triage Notes (Addendum)
Patient c/o facial swelling x2 days. Per patient woke this morning with rash on both sides of nose that itches. Patient states "It feels like I'm breathing cold ice sickles in when a breath through my nose and my nose is running." Patient denies any injury to face. Denies any fevers, cough, loss of smell or taste, sore throat or difficulty breathing. Denies any new medications, food, etc.

## 2019-04-05 NOTE — ED Provider Notes (Signed)
[facility] EMERGENCY DEPARTMENT Provider Note   CSN: [id number] Arrival date & time: 04/05/19  1103     History Chief Complaint  Patient presents with  . Facial Swelling    Tanya Gonzalez is a 44 y.o. female.  Tanya Gonzalez is a 44 y.o. female with a history of IBS, hypothyroidism, GERD, fibromyalgia, interstitial cystitis, migraines, who presents to the emergency department for evaluation of swelling of both eyes and nasal congestion.  Symptoms have been present for 2-3 days.  She reports her eyes have been swollen but she has not had any purulent drainage or crusting, she has reported some tearing and eyes feel itchy and irritated.  She also reports that her nose has become a little bit red and irritated and she has had some nasal drainage and congestion, she states that it burns when she breathes in cool air and she has some sinus pain.  She denies any sore throat or cough.  Denies any fevers or chills.  No chest pain or shortness of breath.  No vomiting, diarrhea or abdominal pain.  No known sick contacts.  Patient states that she works at [employer].  She does report that she has seasonal allergies and this feels somewhat similar.  She has not taken anything to treat her symptoms prior to arrival.  No other aggravating or alleviating factors.        Past Medical History:  Diagnosis Date  . Carpal tunnel syndrome of right wrist 10/2012  . Fibromyalgia   . GERD (gastroesophageal reflux disease)   . Hypothyroidism   . IBS (irritable bowel syndrome)   . IC (interstitial cystitis)   . Migraines   . Self-catheterizes urinary bladder    prn    Patient Active Problem List   Diagnosis Date Noted  . Rectal bleeding 10/23/2011  . Constipation 10/23/2011  . RLQ abdominal pain 10/23/2011  . Hemorrhoids 10/23/2011  . IBS (irritable bowel syndrome) 06/22/2010  . Epigastric pain 06/22/2010  . Chronic nausea 06/22/2010  . GERD 01/11/2010  . OTHER DYSPHAGIA 01/11/2010    Past  Surgical History:  Procedure Laterality Date  . CARPAL TUNNEL RELEASE Right 10/29/2012   Procedure: CARPAL TUNNEL RELEASE;  Surgeon: Wynonia Sours, MD;  Location: [facility];  Service: Orthopedics;  Laterality: Right;  . CHOLECYSTECTOMY    . COLONOSCOPY  11/09/2011      . CYSTOSCOPY  01/27/2010   with bladder hydrodistention  . ESOPHAGOGASTRODUODENOSCOPY  01/2010  . LAPAROSCOPIC SALPINGOOPHERECTOMY Right 01/22/2003  . SALPINGOOPHORECTOMY Left 10/24/2001  . VAGINAL HYSTERECTOMY  10/24/2001     OB History    Gravida  5   Para  4   Term  2   Preterm  2   AB  1   Living  4     SAB  1   TAB      Ectopic      Multiple      Live Births              Family History  Problem Relation Age of Onset  . Crohn's disease Maternal Grandmother   . Cervical cancer Maternal Aunt   . Ovarian cancer Maternal Aunt     Social History   Tobacco Use  . Smoking status: Current Every Day Smoker    Packs/day: 0.50    Years: 15.00    Pack years: 7.50    Types: Cigarettes  . Smokeless tobacco: Never Used  Substance Use Topics  .  Alcohol use: Yes    Comment: rarely  . Drug use: Yes    Types: Marijuana    Home Medications Prior to Admission medications   Medication Sig Start Date End Date Taking? Authorizing Provider  celecoxib (CELEBREX) 100 MG capsule Take 1 capsule (100 mg total) by mouth 2 (two) times daily. Patient not taking: Reported on 06/24/2014 02/27/14   Ivery Quale, [name]  diazepam (VALIUM) 5 MG tablet Take 5 mg by mouth at bedtime.    [provider]  diclofenac sodium (VOLTAREN) 1 % GEL Apply 2 g topically 2 (two) times daily as needed. For leg pain    [provider]  HYDROcodone-acetaminophen (NORCO/VICODIN) 5-325 MG tablet Take 1 tablet by mouth every 4 (four) hours as needed. 09/27/17   Burgess Amor, [name]  levothyroxine (SYNTHROID, LEVOTHROID) 100 MCG tablet Take 100 mcg by mouth daily before breakfast.    [provider]    Linaclotide Karlene Einstein) 290 MCG CAPS Take 1 capsule by mouth daily before breakfast. 10/23/11   Tiffany Kocher, [name]  methocarbamol (ROBAXIN-750) 750 MG tablet Take 1 tablet (750 mg total) by mouth 4 (four) times daily. 09/27/17   Burgess Amor, [name]  NONFORMULARY OR COMPOUNDED ITEM Take 1 capsule by mouth daily. Compounded capsule with elmiron 100 mg, lidocaine 10 mg, and sodium bicarb 5 mg)    [provider]  predniSONE (DELTASONE) 10 MG tablet Take 6 tablets day one, 5 tablets day two, 4 tablets day three, 3 tablets day four, 2 tablets day five, then 1 tablet day six 09/27/17   Burgess Amor, [name]    Allergies    Ciprofloxacin and Codeine  Review of Systems   Review of Systems  Constitutional: Negative for chills and fever.  HENT: Positive for congestion, facial swelling and rhinorrhea. Negative for nosebleeds and sore throat.   Respiratory: Negative for cough and shortness of breath.   Cardiovascular: Negative for chest pain.  Gastrointestinal: Negative for abdominal pain, diarrhea and vomiting.  Musculoskeletal: Negative for myalgias, neck pain and neck stiffness.  Skin: Negative for color change and rash.  Neurological: Positive for headaches.    Physical Exam Updated Vital Signs BP 130/82 (BP Location: Right Arm)   Pulse 68   Temp 98.7 F (37.1 C) (Oral)   Resp 18   Ht 5\' 2"  (1.575 m)   Wt 61.7 kg   SpO2 99%   BMI 24.87 kg/m   Physical Exam Vitals and nursing note reviewed.  Constitutional:      General: She is not in acute distress.    Appearance: Normal appearance. She is well-developed and normal weight. She is not ill-appearing or diaphoretic.  HENT:     Head: Normocephalic and atraumatic.     Nose:     Comments: Bilateral nares with erythematous and swollen mucosa, clear rhinorrhea present no focal tenderness noted over the maxillary or frontal sinuses    Mouth/Throat:     Mouth: Mucous membranes are moist.     Pharynx: Oropharynx is clear.     Comments:  See her oropharynx is clear, mucous membranes moist, no erythema, edema or exudates. Eyes:     General:        Right eye: No discharge.        Left eye: No discharge.     Comments: Slight swelling over both upper and lower eyelids without erythema, no palpable induration or fluctuance, conjunctive a are clear without erythema or drainage, no scleral injection, EOMI, PERRLA  Cardiovascular:  Rate and Rhythm: Normal rate and regular rhythm.     Heart sounds: Normal heart sounds. No murmur. No friction rub.  Pulmonary:     Effort: Pulmonary effort is normal. No respiratory distress.     Breath sounds: Normal breath sounds.     Comments: Respirations equal and unlabored, patient able to speak in full sentences, lungs clear to auscultation bilaterally Musculoskeletal:        General: No deformity.     Cervical back: Neck supple.  Skin:    General: Skin is warm and dry.  Neurological:     Mental Status: She is alert and oriented to person, place, and time.     Coordination: Coordination normal.  Psychiatric:        Mood and Affect: Mood normal.        Behavior: Behavior normal.     ED Results / Procedures / Treatments   Labs (all labs ordered are listed, but only abnormal results are displayed) Labs Reviewed  NOVEL CORONAVIRUS, NAA (HOSP ORDER, SEND-OUT TO REF LAB; TAT 18-24 HRS)    EKG None  Radiology No results found.  Procedures Procedures (including critical care time)  Medications Ordered in ED Medications - No data to display  ED Course  I have reviewed the triage vital signs and the nursing notes.  Pertinent labs & imaging results that were available during my care of the patient were reviewed by me and considered in my medical decision making (see chart for details).    MDM Rules/Calculators/A&P                      44 year old female presents with swelling of the eyelids, eye irritation, and nasal congestion symptoms have been present for 2 to 3 days.  No  associated fevers or cough.  Symptoms suggestive of allergic inflammation.  Eyes without erythema or purulent drainage to suggest bacterial conjunctivitis, no concern for orbital cellulitis.  Nares with clear rhinorrhea and erythema.  Patient does work in [employer] and given nasal congestion she is requesting Covid test which I feel is reasonable I feel this is most likely related to allergies we will treat with Zyrtec, Flonase, and ketotifen eye drops.  Discussed home quarantine until her Covid results return.  Discussed appropriate symptomatic treatment and return precautions.  Patient expresses understanding and agreement with plan.  Discharged home in good condition.  Final Clinical Impression(s) / ED Diagnoses Final diagnoses:  Allergic conjunctivitis of both eyes  Nasal congestion    Rx / DC Orders ED Discharge Orders    None       Legrand Rams 04/05/19 1231    Bethann Berkshire, MD 04/06/19 [phone]

## 2019-04-06 LAB — NOVEL CORONAVIRUS, NAA (HOSP ORDER, SEND-OUT TO REF LAB; TAT 18-24 HRS): SARS-CoV-2, NAA: NOT DETECTED

## 2020-05-08 ENCOUNTER — Other Ambulatory Visit: Payer: Self-pay

## 2020-05-08 ENCOUNTER — Encounter (HOSPITAL_COMMUNITY): Payer: Self-pay

## 2020-05-08 ENCOUNTER — Emergency Department (HOSPITAL_COMMUNITY)
Admission: EM | Admit: 2020-05-08 | Discharge: 2020-05-09 | Disposition: A | Discharge status: Home / Self Care | Payer: Self-pay | Attending: Emergency Medicine | Admitting: Emergency Medicine

## 2020-05-08 DIAGNOSIS — Z79899 Other long term (current) drug therapy: Secondary | ICD-10-CM | POA: Insufficient documentation

## 2020-05-08 DIAGNOSIS — F17210 Nicotine dependence, cigarettes, uncomplicated: Secondary | ICD-10-CM | POA: Insufficient documentation

## 2020-05-08 DIAGNOSIS — E039 Hypothyroidism, unspecified: Secondary | ICD-10-CM | POA: Insufficient documentation

## 2020-05-08 DIAGNOSIS — M79602 Pain in left arm: Secondary | ICD-10-CM | POA: Insufficient documentation

## 2020-05-08 DIAGNOSIS — R202 Paresthesia of skin: Secondary | ICD-10-CM | POA: Insufficient documentation

## 2020-05-08 MED ORDER — NAPROXEN 250 MG PO TABS
500.0000 mg | ORAL_TABLET | Freq: Once | ORAL | Status: AC
  Administered 2020-05-09: 500 mg via ORAL
  Filled 2020-05-08: qty 2

## 2020-05-08 NOTE — ED Provider Notes (Incomplete)
[facility] EMERGENCY DEPARTMENT Provider Note   CSN: [id number] Arrival date & time: 05/08/20  2328   History Chief Complaint  Patient presents with  . Arm Pain    Tanya Gonzalez is a 45 y.o. female.  The history is provided by the patient.  Arm Pain  She complains of pain and numbness in her left arm for the last week.  Pain starts in her neck and goes all the way down her arm to the wrist.  Numbness includes the entire arm, as well as the thumb through the ring finger.  Symptoms have been stable over the course of the week.  It seems to be worse when she is at rest, she does not notice it as much when she is active.  She denies any weakness.  Pain is rated at 5/10.  Past Medical History:  Diagnosis Date  . Carpal tunnel syndrome of right wrist 10/2012  . Fibromyalgia   . GERD (gastroesophageal reflux disease)   . Hypothyroidism   . IBS (irritable bowel syndrome)   . IC (interstitial cystitis)   . Migraines   . Self-catheterizes urinary bladder    prn    Patient Active Problem List   Diagnosis Date Noted  . Rectal bleeding 10/23/2011  . Constipation 10/23/2011  . RLQ abdominal pain 10/23/2011  . Hemorrhoids 10/23/2011  . IBS (irritable bowel syndrome) 06/22/2010  . Epigastric pain 06/22/2010  . Chronic nausea 06/22/2010  . GERD 01/11/2010  . OTHER DYSPHAGIA 01/11/2010    Past Surgical History:  Procedure Laterality Date  . CARPAL TUNNEL RELEASE Right 10/29/2012   Procedure: CARPAL TUNNEL RELEASE;  Surgeon: Nicki Reaper, MD;  Location: [facility];  Service: Orthopedics;  Laterality: Right;  . CHOLECYSTECTOMY    . COLONOSCOPY  11/09/2011      . CYSTOSCOPY  01/27/2010   with bladder hydrodistention  . ESOPHAGOGASTRODUODENOSCOPY  01/2010  . LAPAROSCOPIC SALPINGOOPHERECTOMY Right 01/22/2003  . SALPINGOOPHORECTOMY Left 10/24/2001  . VAGINAL HYSTERECTOMY  10/24/2001     OB History    Gravida  5   Para  4   Term  2   Preterm  2   AB  1    Living  4     SAB  1   IAB      Ectopic      Multiple      Live Births              Family History  Problem Relation Age of Onset  . Crohn's disease Maternal Grandmother   . Cervical cancer Maternal Aunt   . Ovarian cancer Maternal Aunt     Social History   Tobacco Use  . Smoking status: Current Every Day Smoker    Packs/day: 0.50    Years: 15.00    Pack years: 7.50    Types: Cigarettes  . Smokeless tobacco: Never Used  Vaping Use  . Vaping Use: Never used  Substance Use Topics  . Alcohol use: Yes    Comment: rarely  . Drug use: Yes    Types: Marijuana    Home Medications Prior to Admission medications   Medication Sig Start Date End Date Taking? Authorizing Provider  celecoxib (CELEBREX) 100 MG capsule Take 1 capsule (100 mg total) by mouth 2 (two) times daily. Patient not taking: Reported on 06/24/2014 02/27/14   Ivery Quale, [name]  diazepam (VALIUM) 5 MG tablet Take 5 mg by mouth at bedtime.    [provider]  diclofenac sodium (VOLTAREN) 1 % GEL Apply 2 g topically 2 (two) times daily as needed. For leg pain    [provider]  HYDROcodone-acetaminophen (NORCO/VICODIN) 5-325 MG tablet Take 1 tablet by mouth every 4 (four) hours as needed. 09/27/17   Burgess Amor, [name]  levothyroxine (SYNTHROID, LEVOTHROID) 100 MCG tablet Take 100 mcg by mouth daily before breakfast.    [provider]  Linaclotide Karlene Einstein) 290 MCG CAPS Take 1 capsule by mouth daily before breakfast. 10/23/11   Tiffany Kocher, [name]  methocarbamol (ROBAXIN-750) 750 MG tablet Take 1 tablet (750 mg total) by mouth 4 (four) times daily. 09/27/17   Burgess Amor, [name]  NONFORMULARY OR COMPOUNDED ITEM Take 1 capsule by mouth daily. Compounded capsule with elmiron 100 mg, lidocaine 10 mg, and sodium bicarb 5 mg)    [provider]  predniSONE (DELTASONE) 10 MG tablet Take 6 tablets day one, 5 tablets day two, 4 tablets day three, 3 tablets day four, 2 tablets day  five, then 1 tablet day six 09/27/17   Idol, Raynelle Fanning, [name]    Allergies    Ciprofloxacin and Codeine  Review of Systems   Review of Systems  All other systems reviewed and are negative.   Physical Exam Updated Vital Signs BP (!) 145/96   Pulse 78   Temp 98.2 F (36.8 C)   Resp 19   SpO2 100%   Physical Exam Vitals and nursing note reviewed.   *** year old ***female, resting comfortably and in no acute distress. Vital signs are ***. Oxygen saturation is ***%, which is normal. Head is normocephalic and atraumatic. PERRLA, EOMI. Oropharynx is clear. Neck is nontender and supple without adenopathy or JVD. Back is nontender and there is no CVA tenderness. Lungs are clear without rales, wheezes, or rhonchi. Chest is nontender. Heart has regular rate and rhythm without murmur. Abdomen is soft, flat, nontender without masses or hepatosplenomegaly and peristalsis is normoactive. Extremities have no cyanosis or edema, full range of motion is present. Skin is warm and dry without rash. Neurologic: Mental status is normal, cranial nerves are intact, there are no motor or sensory deficits.  ED Results / Procedures / Treatments   Labs (all labs ordered are listed, but only abnormal results are displayed) Labs Reviewed - No data to display  EKG None  Radiology No results found.  Procedures Procedures {Remember to document critical care time when appropriate:1}  Medications Ordered in ED Medications - No data to display  ED Course  I have reviewed the triage vital signs and the nursing notes.  Pertinent labs & imaging results that were available during my care of the patient were reviewed by me and considered in my medical decision making (see chart for details).    MDM Rules/Calculators/A&P                          *** Final Clinical Impression(s) / ED Diagnoses Final diagnoses:  None    Rx / DC Orders ED Discharge Orders    None

## 2020-05-08 NOTE — ED Triage Notes (Signed)
Pt reports L arm "going numb and tingling" and aching x 1 week. Reports feeling goes down arm and into middle finger and ring finger.

## 2020-05-08 NOTE — ED Provider Notes (Addendum)
[facility] EMERGENCY DEPARTMENT Provider Note   CSN: [id number] Arrival date & time: 05/08/20  2328   History Chief Complaint  Patient presents with  . Arm Pain    Tanya Gonzalez is a 45 y.o. female.  The history is provided by the patient.  Arm Pain  She complains of pain and numbness in her left arm for the last week.  Pain starts in her neck and goes all the way down her arm to the wrist.  Numbness includes the entire arm, as well as the thumb through the ring finger.  Symptoms have been stable over the course of the week.  It seems to be worse when she is at rest, she does not notice it as much when she is active.  She denies any weakness.  Pain is rated at 5/10.  Her job does involve repetitive movements, but she is right-handed and most of the movements are by the right hand and arm.  She also uses a Writer, but holds it in her right hand and there is no vibration directed to her left hand.  At home, she has taken acetaminophen without relief.  Past Medical History:  Diagnosis Date  . Carpal tunnel syndrome of right wrist 10/2012  . Fibromyalgia   . GERD (gastroesophageal reflux disease)   . Hypothyroidism   . IBS (irritable bowel syndrome)   . IC (interstitial cystitis)   . Migraines   . Self-catheterizes urinary bladder    prn    Patient Active Problem List   Diagnosis Date Noted  . Rectal bleeding 10/23/2011  . Constipation 10/23/2011  . RLQ abdominal pain 10/23/2011  . Hemorrhoids 10/23/2011  . IBS (irritable bowel syndrome) 06/22/2010  . Epigastric pain 06/22/2010  . Chronic nausea 06/22/2010  . GERD 01/11/2010  . OTHER DYSPHAGIA 01/11/2010    Past Surgical History:  Procedure Laterality Date  . CARPAL TUNNEL RELEASE Right 10/29/2012   Procedure: CARPAL TUNNEL RELEASE;  Surgeon: Nicki Reaper, MD;  Location: [facility];  Service: Orthopedics;  Laterality: Right;  . CHOLECYSTECTOMY    . COLONOSCOPY  11/09/2011      . CYSTOSCOPY   01/27/2010   with bladder hydrodistention  . ESOPHAGOGASTRODUODENOSCOPY  01/2010  . LAPAROSCOPIC SALPINGOOPHERECTOMY Right 01/22/2003  . SALPINGOOPHORECTOMY Left 10/24/2001  . VAGINAL HYSTERECTOMY  10/24/2001     OB History    Gravida  5   Para  4   Term  2   Preterm  2   AB  1   Living  4     SAB  1   IAB      Ectopic      Multiple      Live Births              Family History  Problem Relation Age of Onset  . Crohn's disease Maternal Grandmother   . Cervical cancer Maternal Aunt   . Ovarian cancer Maternal Aunt     Social History   Tobacco Use  . Smoking status: Current Every Day Smoker    Packs/day: 0.50    Years: 15.00    Pack years: 7.50    Types: Cigarettes  . Smokeless tobacco: Never Used  Vaping Use  . Vaping Use: Never used  Substance Use Topics  . Alcohol use: Yes    Comment: rarely  . Drug use: Yes    Types: Marijuana    Home Medications Prior to Admission medications   Medication Sig Start  Date End Date Taking? Authorizing Provider  celecoxib (CELEBREX) 100 MG capsule Take 1 capsule (100 mg total) by mouth 2 (two) times daily. Patient not taking: Reported on 06/24/2014 02/27/14   Ivery Quale, [name]  diazepam (VALIUM) 5 MG tablet Take 5 mg by mouth at bedtime.    [provider]  diclofenac sodium (VOLTAREN) 1 % GEL Apply 2 g topically 2 (two) times daily as needed. For leg pain    [provider]  HYDROcodone-acetaminophen (NORCO/VICODIN) 5-325 MG tablet Take 1 tablet by mouth every 4 (four) hours as needed. 09/27/17   Burgess Amor, [name]  levothyroxine (SYNTHROID, LEVOTHROID) 100 MCG tablet Take 100 mcg by mouth daily before breakfast.    [provider]  Linaclotide Karlene Einstein) 290 MCG CAPS Take 1 capsule by mouth daily before breakfast. 10/23/11   Tiffany Kocher, [name]  methocarbamol (ROBAXIN-750) 750 MG tablet Take 1 tablet (750 mg total) by mouth 4 (four) times daily. 09/27/17   Burgess Amor, [name]  NONFORMULARY OR  COMPOUNDED ITEM Take 1 capsule by mouth daily. Compounded capsule with elmiron 100 mg, lidocaine 10 mg, and sodium bicarb 5 mg)    [provider]  predniSONE (DELTASONE) 10 MG tablet Take 6 tablets day one, 5 tablets day two, 4 tablets day three, 3 tablets day four, 2 tablets day five, then 1 tablet day six 09/27/17   Idol, Raynelle Fanning, [name]    Allergies    Ciprofloxacin and Codeine  Review of Systems   Review of Systems  All other systems reviewed and are negative.   Physical Exam Updated Vital Signs BP (!) 145/96   Pulse 78   Temp 98.2 F (36.8 C)   Resp 19   SpO2 100%   Physical Exam Vitals and nursing note reviewed.   45 year old female, resting comfortably and in no acute distress. Vital signs are significant for slightly elevated blood pressure. Oxygen saturation is 100%, which is normal. Head is normocephalic and atraumatic. PERRLA, EOMI. Oropharynx is clear. Neck is 0supple with mild tenderness in the lower left paracervical area.  There is no adenopathy or JVD. Back is tender over the left trapezius muscle.  There is no CVA tenderness. Lungs are clear without rales, wheezes, or rhonchi. Chest is nontender. Heart has regular rate and rhythm without murmur. Abdomen is soft, flat, nontender without masses or hepatosplenomegaly and peristalsis is normoactive. Extremities: There is mild tenderness to palpation in the anterior deltoid groove on the left.  There are mild rotator cuff impingement signs present on the left.  There is full passive range of motion of all joints without significant pain.  There is slight weakness of pincer grasp on the left compared with the right.  Sensation is mostly normal on the left with a small area of decreased sensation in the pad of the left thumb and the dorsum of the left long finger.  There is normal strength of all other muscles of the left arm except for pincer grasp. Skin is warm and dry without rash. Neurologic: Mental status is  normal, cranial nerves are intact.  Motor and sensory exam normal except as noted above.  ED Results / Procedures / Treatments    Procedures Procedures   Medications Ordered in ED Medications  naproxen (NAPROSYN) tablet 500 mg (has no administration in time range)    ED Course  I have reviewed the triage vital signs and the nursing notes.  MDM Rules/Calculators/A&P Left arm pain and paresthesias.  This can be coming  from a cervical radiculopathy, rotator cuff injury, carpal tunnel syndrome.  Exact cause is not clear.  However, plain x-rays are not going to be helpful in diagnosis.  Old records are reviewed, and she does have a history of surgery for right carpal tunnel syndrome in 2014.  Most of the findings today are not suggestive of carpal tunnel syndrome.  I suspect this is either a cervical radiculopathy or rotator cuff injury.  She is advised to take over-the-counter naproxen, supplement with acetaminophen.  Referred back to her primary care provider for additional outpatient work-up.  Final Clinical Impression(s) / ED Diagnoses Final diagnoses:  Left arm pain  Paresthesia of left arm    Rx / DC Orders ED Discharge Orders    None       Dione Booze, MD 05/09/20 0007  Patient expressed concern that her left arm pain and numbness could be a heart attack.  She states her mother had heart surgery in her early 35s.  Patient does smoke and has history of hypertension and hyperlipidemia with no history of diabetes and no history of obesity.  ECG was obtained and showed no concerning findings.  Patient reassured that history is not suggestive of cardiac disease.   EKG Interpretation  Date/Time:  Sunday May 09 2020 00:15:09 EDT Ventricular Rate:  63 PR Interval:    QRS Duration: 93 QT Interval:  411 QTC Calculation: 421 R Axis:   15 Text Interpretation: Sinus rhythm Prolonged PR interval Consider left atrial enlargement RSR' in V1 or V2, right VCD or RVH Consider anterior  infarct, old No old tracing to compare Confirmed by Dione Booze ([id number]) on 05/09/2020 12:28:57 AM         Dione Booze, MD 05/09/20 0030

## 2020-05-09 NOTE — Discharge Instructions (Signed)
Your left arm pain and numbness are likely coming from a pinched nerve.  That nerve can be pinched in your neck, shoulder, or wrist.  Please take naproxen, 2 tablets at a time, twice a day.  You may also take acetaminophen for additional pain relief.  Follow-up with your primary care provider who will arrange additional diagnostic work and treatment.

## 2020-05-20 ENCOUNTER — Ambulatory Visit (INDEPENDENT_AMBULATORY_CARE_PROVIDER_SITE_OTHER): Payer: Self-pay | Admitting: Orthopaedic Surgery

## 2020-05-20 ENCOUNTER — Other Ambulatory Visit: Payer: Self-pay

## 2020-05-20 ENCOUNTER — Encounter: Payer: Self-pay | Admitting: Orthopaedic Surgery

## 2020-05-20 DIAGNOSIS — M542 Cervicalgia: Secondary | ICD-10-CM | POA: Insufficient documentation

## 2020-05-20 NOTE — Progress Notes (Signed)
Office Visit Note   Patient: Tanya Gonzalez           Date of Birth: [birth year]           MRN: [id number] Visit Date: 05/20/2020              Requested by: Kirstie Peri, MD [address] [place],  [place] [address] PCP: Kirstie Peri, MD   Assessment & Plan: Visit Diagnoses:  1. Neck pain     Plan: Patient with some cervical spondylosis and foraminal stenosis symptomatic on the left radiating into the C6-C7 distribution.  We will place on a prednisone Dosepak.  Recheck 3 to 4 weeks.  Follow-Up Instructions: Return in about 3 weeks (around 06/10/2020).   Orders:  No orders of the defined types were placed in this encounter.  No orders of the defined types were placed in this encounter.     Procedures: No procedures performed   Clinical Data: No additional findings.   Subjective: Chief Complaint  Patient presents with  . Neck - Pain  . Left Arm - Pain, Numbness    HPI 45 year old female seen with left arm pain and numbness involving the thumb into the long finger.  Said pain in the left arm left side neck pain sharp pain when she turns her head.  Previously treated with epidural steroid injection which gave her a reaction.  X-rays taken the hospital 05/10/2020 showed multilevel spondylosis C3-C7 with foraminal stenosis C4-5 C6-7 more pronounced.  She denies any right arm symptoms no long tract symptoms no weakness.  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  She has been there for few months and does a lot of bending lifting some lifting overhead.  Review of Systems patient takes thyroid supplement previous gallbladder surgery hysterectomy.  All the systems are noncontributory.   Objective: Vital Signs: Ht 5\' 2"  (1.575 m)   Wt 135 lb (61.2 kg)   BMI 24.69 kg/m   Physical Exam Constitutional:      Appearance: She is well-developed.  HENT:     Head: Normocephalic.     Right Ear: External ear normal.     Left Ear: External ear normal.  Eyes:      Pupils: Pupils are equal, round, and reactive to light.  Neck:     Thyroid: No thyromegaly.     Trachea: No tracheal deviation.  Cardiovascular:     Rate and Rhythm: Normal rate.  Pulmonary:     Effort: Pulmonary effort is normal.  Abdominal:     Palpations: Abdomen is soft.  Skin:    General: Skin is warm and dry.  Neurological:     Mental Status: She is alert and oriented to person, place, and time.  Psychiatric:        Behavior: Behavior normal.     Ortho Exam Increased pain with cervical compression some relief with distraction positive brachial plexus tenderness but Spurling on the left negative on the right.  Upper extremity reflexes are 2+ and symmetrical.  No thenar atrophy carpal tunnel exam negative. Specialty Comments:  No specialty comments available.  Imaging: No results found.   PMFS History: Patient Active Problem List   Diagnosis Date Noted  . Neck pain 05/20/2020  . Rectal bleeding 10/23/2011  . Constipation 10/23/2011  . RLQ abdominal pain 10/23/2011  . Hemorrhoids 10/23/2011  . IBS (irritable bowel syndrome) 06/22/2010  . Epigastric pain 06/22/2010  . Chronic nausea 06/22/2010  . GERD 01/11/2010  .  OTHER DYSPHAGIA 01/11/2010   Past Medical History:  Diagnosis Date  . Carpal tunnel syndrome of right wrist 10/2012  . Fibromyalgia   . GERD (gastroesophageal reflux disease)   . Hypothyroidism   . IBS (irritable bowel syndrome)   . IC (interstitial cystitis)   . Migraines   . Self-catheterizes urinary bladder    prn    Family History  Problem Relation Age of Onset  . Crohn's disease Maternal Grandmother   . Cervical cancer Maternal Aunt   . Ovarian cancer Maternal Aunt     Past Surgical History:  Procedure Laterality Date  . CARPAL TUNNEL RELEASE Right 10/29/2012   Procedure: CARPAL TUNNEL RELEASE;  Surgeon: Nicki Reaper, MD;  [facility];  Service: Orthopedics;  Laterality: Right;  . CHOLECYSTECTOMY    .  COLONOSCOPY  11/09/2011      . CYSTOSCOPY  01/27/2010   with bladder hydrodistention  . ESOPHAGOGASTRODUODENOSCOPY  01/2010  . LAPAROSCOPIC SALPINGOOPHERECTOMY Right 01/22/2003  . SALPINGOOPHORECTOMY Left 10/24/2001  . VAGINAL HYSTERECTOMY  10/24/2001   Social History   Occupational History    Employer: UNEMPLOYED  Tobacco Use  . Smoking status: Current Every Day Smoker    Packs/day: 0.50    Years: 15.00    Pack years: 7.50    Types: Cigarettes  . Smokeless tobacco: Never Used  Vaping Use  . Vaping Use: Never used  Substance and Sexual Activity  . Alcohol use: Yes    Comment: rarely  . Drug use: Yes    Types: Marijuana  . Sexual activity: Yes    Birth control/protection: Surgical

## 2020-06-10 ENCOUNTER — Ambulatory Visit: Payer: Self-pay | Admitting: Orthopaedic Surgery

## 2020-06-10 ENCOUNTER — Other Ambulatory Visit: Payer: Self-pay

## 2021-06-23 ENCOUNTER — Other Ambulatory Visit: Payer: Self-pay | Admitting: Internal Medicine

## 2021-06-23 ENCOUNTER — Other Ambulatory Visit: Payer: Self-pay

## 2021-06-23 DIAGNOSIS — Z1231 Encounter for screening mammogram for malignant neoplasm of breast: Secondary | ICD-10-CM

## 2021-07-13 ENCOUNTER — Encounter: Payer: Self-pay | Admitting: *Deleted

## 2021-07-15 ENCOUNTER — Emergency Department (HOSPITAL_COMMUNITY): Payer: 59

## 2021-07-15 ENCOUNTER — Emergency Department (HOSPITAL_COMMUNITY)
Admission: EM | Admit: 2021-07-15 | Discharge: 2021-07-16 | Disposition: A | Discharge status: Home / Self Care | Payer: 59 | Attending: Emergency Medicine | Admitting: Emergency Medicine

## 2021-07-15 ENCOUNTER — Encounter (HOSPITAL_COMMUNITY): Payer: Self-pay | Admitting: Emergency Medicine

## 2021-07-15 DIAGNOSIS — R0789 Other chest pain: Secondary | ICD-10-CM | POA: Insufficient documentation

## 2021-07-15 DIAGNOSIS — R079 Chest pain, unspecified: Secondary | ICD-10-CM | POA: Diagnosis present

## 2021-07-15 LAB — CBC WITH DIFFERENTIAL/PLATELET
Abs Immature Granulocytes: 0.03 10*3/uL (ref 0.00–0.07)
Basophils Absolute: 0.1 10*3/uL (ref 0.0–0.1)
Basophils Relative: 1 %
Eosinophils Absolute: 0.2 10*3/uL (ref 0.0–0.5)
Eosinophils Relative: 2 %
HCT: 40.7 % (ref 36.0–46.0)
Hemoglobin: 13.5 g/dL (ref 12.0–15.0)
Immature Granulocytes: 0 %
Lymphocytes Relative: 39 %
Lymphs Abs: 3.6 10*3/uL (ref 0.7–4.0)
MCH: 31.8 pg (ref 26.0–34.0)
MCHC: 33.2 g/dL (ref 30.0–36.0)
MCV: 95.8 fL (ref 80.0–100.0)
Monocytes Absolute: 0.7 10*3/uL (ref 0.1–1.0)
Monocytes Relative: 8 %
Neutro Abs: 4.7 10*3/uL (ref 1.7–7.7)
Neutrophils Relative %: 50 %
Platelets: 262 10*3/uL (ref 150–400)
RBC: 4.25 MIL/uL (ref 3.87–5.11)
RDW: 13.1 % (ref 11.5–15.5)
WBC: 9.4 10*3/uL (ref 4.0–10.5)
nRBC: 0 % (ref 0.0–0.2)

## 2021-07-15 NOTE — ED Triage Notes (Signed)
Pt c/o sharpe left sided chest pain since yesterday. Pt states sometimes the pain takes her breath and also c/o some upper back pain.

## 2021-07-16 LAB — BASIC METABOLIC PANEL
Anion gap: 6 (ref 5–15)
BUN: 18 mg/dL (ref 6–20)
CO2: 31 mmol/L (ref 22–32)
Calcium: 9.5 mg/dL (ref 8.9–10.3)
Chloride: 106 mmol/L (ref 98–111)
Creatinine, Ser: 0.88 mg/dL (ref 0.44–1.00)
GFR, Estimated: >60 mL/min (ref >60)
Glucose, Bld: 100 mg/dL — ABNORMAL HIGH (ref 70–99)
Potassium: 3.9 mmol/L (ref 3.5–5.1)
Sodium: 143 mmol/L (ref 135–145)

## 2021-07-16 LAB — TROPONIN I (HIGH SENSITIVITY): Troponin I (High Sensitivity): <2 ng/L (ref <18)

## 2021-07-16 MED ORDER — IBUPROFEN 400 MG PO TABS: 600.0000 mg | ORAL_TABLET | Freq: Once | ORAL | Status: DC

## 2021-07-16 NOTE — ED Provider Notes (Signed)
[facility] EMERGENCY DEPARTMENT  Provider Note  CSN: [id number] Arrival date & time: 07/15/21 2208  History Chief Complaint  Patient presents with   Chest Pain    Tanya Gonzalez is a 46 y.o. female with no significant PMH reports 2 days of L parasternal chest pains. Persistent since then, some radiation to upper back, does not radiate into arm. No SOB, fever but has had some mild cough. No history of HTN, DM, HLD or CAD.    Home Medications Prior to Admission medications   Medication Sig Start Date End Date Taking? Authorizing Provider  levothyroxine (SYNTHROID, LEVOTHROID) 100 MCG tablet Take 100 mcg by mouth daily before breakfast.    [provider]     Allergies    Ciprofloxacin and Codeine   Review of Systems   Review of Systems Please see HPI for pertinent positives and negatives  Physical Exam BP 132/88 (BP Location: Right Arm)   Pulse 80   Temp 97.9 F (36.6 C) (Oral)   Resp 17   Ht 5\' 2"  (1.575 m)   Wt 61.2 kg   SpO2 99%   BMI 24.68 kg/m   Physical Exam Vitals and nursing note reviewed.  Constitutional:      Appearance: Normal appearance.  HENT:     Head: Normocephalic and atraumatic.     Nose: Nose normal.     Mouth/Throat:     Mouth: Mucous membranes are moist.  Eyes:     Extraocular Movements: Extraocular movements intact.     Conjunctiva/sclera: Conjunctivae normal.  Cardiovascular:     Rate and Rhythm: Normal rate.     Heart sounds: Normal heart sounds.    No friction rub.  Pulmonary:     Effort: Pulmonary effort is normal.     Breath sounds: Normal breath sounds.  Chest:     Chest wall: Tenderness (reproduces pain) present.  Abdominal:     General: Abdomen is flat.     Palpations: Abdomen is soft.     Tenderness: There is no abdominal tenderness.  Musculoskeletal:        General: No swelling. Normal range of motion.     Cervical back: Neck supple.  Skin:    General: Skin is warm and dry.  Neurological:     General: No  focal deficit present.     Mental Status: She is alert.  Psychiatric:        Mood and Affect: Mood normal.    ED Results / Procedures / Treatments   EKG EKG Interpretation  Date/Time:  Friday July 15 2021 22:40:06 EDT Ventricular Rate:  72 PR Interval:  224 QRS Duration: 95 QT Interval:  392 QTC Calculation: 429 R Axis:   52 Text Interpretation: Sinus rhythm Prolonged PR interval Left atrial enlargement RSR' in V1 or V2, right VCD or RVH Consider anterior infarct No significant change since last tracing Confirmed by 03-11-1994 [phone]) on 07/16/2021 12:34:26 AM  Procedures Procedures  Medications Ordered in the ED Medications  ibuprofen (ADVIL) tablet 600 mg (has no administration in time range)    Initial Impression and Plan  Patient with atypical reproducible chest pains. Low risk factor profile for ACS. CBC, BMP, Trop and EKG are all unremarkable. I personally viewed the images from radiology studies and agree with radiologist interpretation: CXR is clear. No concern for PE, PTX, dissection or other life threatening cause of pain. Trop is neg after >24 hours of pain, does not need repeating. Plan discharge with recommendation  for OTC motion and rest. PCP follow up.    ED Course       MDM Rules/Calculators/A&P Medical Decision Making Problems Addressed: Chest wall pain: acute illness or injury  Amount and/or Complexity of Data Reviewed Labs: ordered. Decision-making details documented in ED Course. Radiology: ordered and independent interpretation performed. Decision-making details documented in ED Course. ECG/medicine tests: ordered and independent interpretation performed. Decision-making details documented in ED Course.  Risk Prescription drug management.    Final Clinical Impression(s) / ED Diagnoses Final diagnoses:  Chest wall pain    Rx / DC Orders ED Discharge Orders     None        Pollyann Savoy, MD 07/16/21 0045

## 2021-07-27 ENCOUNTER — Ambulatory Visit: Payer: Self-pay

## 2021-12-19 ENCOUNTER — Encounter: Payer: Self-pay | Admitting: *Deleted

## 2022-05-17 DIAGNOSIS — E559 Vitamin D deficiency, unspecified: Secondary | ICD-10-CM | POA: Diagnosis not present

## 2022-05-17 DIAGNOSIS — E039 Hypothyroidism, unspecified: Secondary | ICD-10-CM | POA: Diagnosis not present

## 2022-05-17 DIAGNOSIS — D509 Iron deficiency anemia, unspecified: Secondary | ICD-10-CM | POA: Diagnosis not present

## 2022-05-17 DIAGNOSIS — R5383 Other fatigue: Secondary | ICD-10-CM | POA: Diagnosis not present

## 2022-06-26 DIAGNOSIS — R0989 Other specified symptoms and signs involving the circulatory and respiratory systems: Secondary | ICD-10-CM | POA: Diagnosis not present

## 2022-06-26 DIAGNOSIS — Z720 Tobacco use: Secondary | ICD-10-CM | POA: Diagnosis not present

## 2022-06-26 DIAGNOSIS — Z20822 Contact with and (suspected) exposure to covid-19: Secondary | ICD-10-CM | POA: Diagnosis not present

## 2022-06-26 DIAGNOSIS — F17210 Nicotine dependence, cigarettes, uncomplicated: Secondary | ICD-10-CM | POA: Diagnosis not present

## 2022-06-26 DIAGNOSIS — R051 Acute cough: Secondary | ICD-10-CM | POA: Diagnosis not present

## 2022-06-26 DIAGNOSIS — R059 Cough, unspecified: Secondary | ICD-10-CM | POA: Diagnosis not present

## 2022-06-26 DIAGNOSIS — J069 Acute upper respiratory infection, unspecified: Secondary | ICD-10-CM | POA: Diagnosis not present

## 2022-06-26 DIAGNOSIS — R062 Wheezing: Secondary | ICD-10-CM | POA: Diagnosis not present

## 2022-07-04 DIAGNOSIS — H109 Unspecified conjunctivitis: Secondary | ICD-10-CM | POA: Diagnosis not present

## 2022-07-04 DIAGNOSIS — J069 Acute upper respiratory infection, unspecified: Secondary | ICD-10-CM | POA: Diagnosis not present

## 2022-07-04 DIAGNOSIS — R0981 Nasal congestion: Secondary | ICD-10-CM | POA: Diagnosis not present

## 2022-07-06 DIAGNOSIS — M79672 Pain in left foot: Secondary | ICD-10-CM | POA: Diagnosis not present

## 2023-05-12 ENCOUNTER — Other Ambulatory Visit: Payer: Self-pay

## 2023-05-12 ENCOUNTER — Emergency Department (HOSPITAL_COMMUNITY)
Admission: EM | Admit: 2023-05-12 | Discharge: 2023-05-12 | Disposition: A | Discharge status: Home / Self Care | Payer: Self-pay | Attending: Emergency Medicine | Admitting: Emergency Medicine

## 2023-05-12 ENCOUNTER — Emergency Department (HOSPITAL_COMMUNITY): Payer: Self-pay

## 2023-05-12 ENCOUNTER — Encounter (HOSPITAL_COMMUNITY): Payer: Self-pay | Admitting: Emergency Medicine

## 2023-05-12 DIAGNOSIS — S62617A Displaced fracture of proximal phalanx of left little finger, initial encounter for closed fracture: Secondary | ICD-10-CM | POA: Insufficient documentation

## 2023-05-12 DIAGNOSIS — E039 Hypothyroidism, unspecified: Secondary | ICD-10-CM | POA: Insufficient documentation

## 2023-05-12 DIAGNOSIS — S62619A Displaced fracture of proximal phalanx of unspecified finger, initial encounter for closed fracture: Secondary | ICD-10-CM

## 2023-05-12 DIAGNOSIS — W1839XA Other fall on same level, initial encounter: Secondary | ICD-10-CM | POA: Insufficient documentation

## 2023-05-12 MED ORDER — IBUPROFEN 600 MG PO TABS: 600.0000 mg | ORAL_TABLET | Freq: Four times a day (QID) | ORAL | 0 refills | Status: AC | PRN

## 2023-05-12 MED ORDER — IBUPROFEN 400 MG PO TABS
600.0000 mg | ORAL_TABLET | Freq: Once | ORAL | Status: AC
  Administered 2023-05-12: 600 mg via ORAL
  Filled 2023-05-12: qty 2

## 2023-05-12 NOTE — Discharge Instructions (Addendum)
 You are seen in the emergency department today for a hand injury, you were found to have a fracture of your little finger on your left hand.  You were put in a splint and it is very important for you to follow-up with a hand specialist.  Come back to the ER if you have new or worsening symptoms.  Can take the ibuprofen as prescribed and also you can take over-the-counter Tylenol as directed on the packaging for discomfort.  Come at the ER if you have any new or worsening symptoms.

## 2023-05-12 NOTE — ED Triage Notes (Signed)
 Pt c/o left pinky pain and swelling after mechanical fall last night. Pt with bruising noted to base of 5th digit and hand.

## 2023-05-12 NOTE — ED Provider Notes (Signed)
 [place] EMERGENCY DEPARTMENT AT [facility] Provider Note   CSN: [id number] Arrival date & time: 05/12/23  1947     History  Chief Complaint  Patient presents with   Finger Injury    Tanya Gonzalez is a 48 y.o. female.  Has past medical history of hypothyroidism.  Presents to ER complaining of left little finger pain and swelling.  She was last night she tripped over a case of water and fell into the fridge and reports that she heard a "crunch" and had pain in the little finger at the MCP joint.  She applied a finger splint today and worked 11 hours and noticed swelling in this area so decided to come to the ER as she fears it is broken.  She denies numbness or tingling but has pain when trying to make a fist.  She is not on blood thinners, denies head injury, did not get dizzy or fall to the ground.  HPI     Home Medications Prior to Admission medications   Medication Sig Start Date End Date Taking? Authorizing Provider  ibuprofen (ADVIL) 600 MG tablet Take 1 tablet (600 mg total) by mouth every 6 (six) hours as needed. 05/12/23  Yes Marwan Lipe A, [name]  levothyroxine (SYNTHROID, LEVOTHROID) 100 MCG tablet Take 100 mcg by mouth daily before breakfast.    [provider]      Allergies    Ciprofloxacin and Codeine    Review of Systems   Review of Systems  Physical Exam Updated Vital Signs BP (!) 151/81 (BP Location: Right Arm)   Pulse 68   Temp 97.9 F (36.6 C) (Oral)   Resp 16   Ht 5\' 2"  (1.575 m)   Wt 61.2 kg   SpO2 100%   BMI 24.68 kg/m  Physical Exam Vitals and nursing note reviewed.  Constitutional:      General: She is not in acute distress.    Appearance: She is well-developed.  HENT:     Head: Normocephalic and atraumatic.  Eyes:     Conjunctiva/sclera: Conjunctivae normal.  Cardiovascular:     Rate and Rhythm: Normal rate and regular rhythm.     Heart sounds: No murmur heard. Pulmonary:     Effort: Pulmonary effort is normal.  No respiratory distress.     Breath sounds: Normal breath sounds.  Abdominal:     Palpations: Abdomen is soft.     Tenderness: There is no abdominal tenderness.  Musculoskeletal:        General: Swelling present.     Cervical back: Neck supple.     Comments: Swelling noted to area about the left fifth MCP joint with tenderness in this area.  Patient can fully extend all joints of the left little finger but has significant pain on flexion of the MCP joint of this finger and cannot fully flex due to pain and swelling.  Normal sensation, normal capillary refill.  Skin:    General: Skin is warm and dry.     Capillary Refill: Capillary refill takes less than 2 seconds.  Neurological:     Mental Status: She is alert.  Psychiatric:        Mood and Affect: Mood normal.     ED Results / Procedures / Treatments   Labs (all labs ordered are listed, but only abnormal results are displayed) Labs Reviewed - No data to display  EKG None  Radiology DG Hand Complete Left Result Date: 05/12/2023 CLINICAL DATA:  fall,  pain EXAM: LEFT HAND - COMPLETE 3+ VIEW COMPARISON:  None Available. FINDINGS: There is a mildly displaced fracture of the radial base of the fifth proximal phalanx. There is intra-articular extension into the fifth MCP. There is associated soft tissue edema. No additional fractures noted. IMPRESSION: Mildly displaced fracture of the radial base of the fifth proximal phalanx with intra-articular extension into the fifth MCP. Electronically Signed   By: Meda Klinefelter M.D.   On: 05/12/2023 20:24    Procedures .Splint Application  Date/Time: 05/12/2023 9:00 PM  Performed by: Ma Rings, [name] Authorized by: Ma Rings, [name]   Consent:    Consent obtained:  Verbal   Consent given by:  Patient   Risks discussed:  Discoloration, numbness, pain and swelling Universal protocol:    Imaging studies available: yes     Patient identity confirmed:  Verbally with  patient Pre-procedure details:    Distal neurologic exam:  Normal   Distal perfusion: brisk capillary refill   Procedure details:    Location:  Hand   Hand location:  L hand   Splint type:  Ulnar gutter   Supplies:  Elastic bandage, fiberglass and cotton padding Post-procedure details:    Distal neurologic exam:  Normal   Distal perfusion: brisk capillary refill     Procedure completion:  Tolerated well, no immediate complications     Medications Ordered in ED Medications  ibuprofen (ADVIL) tablet 600 mg (600 mg Oral Given 05/12/23 2101)    ED Course/ Medical Decision Making/ A&P                                 Medical Decision Making DDx: Fracture, dislocation, sprain, contusion, other  ED course: Patient has left little finger pain after fall into her fridge last night after tripping.  X-ray ordered and interpreted independently by myself.  Shows a mildly displaced fracture of the radial base of the proximal phalanx of the fifth finger.  I agree with radiology interpretation.  Does not appear to be any tendon injury on exam though somewhat difficult to fully examine flexion due to swelling and discomfort.  Discussed with patient plan to treat pain with NSAIDs and apply and ulnar gutter splint.  She is instructed on splint care and follow-up with hand surgery.  Agreeable with plan of care, denies any further questions at this time and is agreeable with this treatment.  Amount and/or Complexity of Data Reviewed Radiology: ordered.  Risk Prescription drug management.           Final Clinical Impression(s) / ED Diagnoses Final diagnoses:  Closed fracture of base of proximal phalanx of finger    Rx / DC Orders ED Discharge Orders          Ordered    ibuprofen (ADVIL) 600 MG tablet  Every 6 hours PRN        05/12/23 2054              Josem Kaufmann 05/12/23 2131    Bethann Berkshire, MD 05/13/23 1135

## 2023-12-06 ENCOUNTER — Emergency Department (HOSPITAL_COMMUNITY): Payer: Self-pay

## 2023-12-06 ENCOUNTER — Emergency Department (HOSPITAL_COMMUNITY)
Admission: EM | Admit: 2023-12-06 | Discharge: 2023-12-07 | Disposition: A | Discharge status: Home / Self Care | Payer: Self-pay | Source: Ambulatory Visit | Attending: Emergency Medicine | Admitting: Emergency Medicine

## 2023-12-06 ENCOUNTER — Encounter (HOSPITAL_COMMUNITY): Payer: Self-pay

## 2023-12-06 ENCOUNTER — Other Ambulatory Visit: Payer: Self-pay

## 2023-12-06 DIAGNOSIS — E860 Dehydration: Secondary | ICD-10-CM | POA: Insufficient documentation

## 2023-12-06 DIAGNOSIS — S3013XA Contusion of flank (latus) region, initial encounter: Secondary | ICD-10-CM | POA: Diagnosis not present

## 2023-12-06 DIAGNOSIS — E039 Hypothyroidism, unspecified: Secondary | ICD-10-CM | POA: Insufficient documentation

## 2023-12-06 DIAGNOSIS — X58XXXA Exposure to other specified factors, initial encounter: Secondary | ICD-10-CM | POA: Insufficient documentation

## 2023-12-06 DIAGNOSIS — R55 Syncope and collapse: Secondary | ICD-10-CM | POA: Diagnosis not present

## 2023-12-06 DIAGNOSIS — Z79890 Hormone replacement therapy (postmenopausal): Secondary | ICD-10-CM | POA: Insufficient documentation

## 2023-12-06 DIAGNOSIS — D72829 Elevated white blood cell count, unspecified: Secondary | ICD-10-CM | POA: Insufficient documentation

## 2023-12-06 DIAGNOSIS — S7001XA Contusion of right hip, initial encounter: Secondary | ICD-10-CM | POA: Insufficient documentation

## 2023-12-06 DIAGNOSIS — S79911A Unspecified injury of right hip, initial encounter: Secondary | ICD-10-CM | POA: Diagnosis present

## 2023-12-06 LAB — I-STAT CHEM 8, ED
BUN: 17 mg/dL (ref 6–20)
Calcium, Ion: 1.14 mmol/L — ABNORMAL LOW (ref 1.15–1.40)
Chloride: 104 mmol/L (ref 98–111)
Creatinine, Ser: 0.8 mg/dL (ref 0.44–1.00)
Glucose, Bld: 104 mg/dL — ABNORMAL HIGH (ref 70–99)
HCT: 45 % (ref 36.0–46.0)
Hemoglobin: 15.3 g/dL — ABNORMAL HIGH (ref 12.0–15.0)
Potassium: 3.5 mmol/L (ref 3.5–5.1)
Sodium: 141 mmol/L (ref 135–145)
TCO2: 26 mmol/L (ref 22–32)

## 2023-12-06 LAB — CBC WITH DIFFERENTIAL/PLATELET
Abs Immature Granulocytes: 0.04 K/uL (ref 0.00–0.07)
Basophils Absolute: 0.1 K/uL (ref 0.0–0.1)
Basophils Relative: 1 %
Eosinophils Absolute: 0.1 K/uL (ref 0.0–0.5)
Eosinophils Relative: 1 %
HCT: 42.7 % (ref 36.0–46.0)
Hemoglobin: 14.2 g/dL (ref 12.0–15.0)
Immature Granulocytes: 0 %
Lymphocytes Relative: 14 %
Lymphs Abs: 1.8 K/uL (ref 0.7–4.0)
MCH: 32.4 pg (ref 26.0–34.0)
MCHC: 33.3 g/dL (ref 30.0–36.0)
MCV: 97.5 fL (ref 80.0–100.0)
Monocytes Absolute: 0.8 K/uL (ref 0.1–1.0)
Monocytes Relative: 7 %
Neutro Abs: 9.7 K/uL — ABNORMAL HIGH (ref 1.7–7.7)
Neutrophils Relative %: 77 %
Platelets: 278 K/uL (ref 150–400)
RBC: 4.38 MIL/uL (ref 3.87–5.11)
RDW: 12.5 % (ref 11.5–15.5)
WBC: 12.5 K/uL — ABNORMAL HIGH (ref 4.0–10.5)
nRBC: 0 % (ref 0.0–0.2)

## 2023-12-06 LAB — COMPREHENSIVE METABOLIC PANEL WITH GFR
ALT: 46 U/L — ABNORMAL HIGH (ref 0–44)
AST: 38 U/L (ref 15–41)
Albumin: 4.6 g/dL (ref 3.5–5.0)
Alkaline Phosphatase: 71 U/L (ref 38–126)
Anion gap: 13 (ref 5–15)
BUN: 12 mg/dL (ref 6–20)
CO2: 26 mmol/L (ref 22–32)
Calcium: 9.4 mg/dL (ref 8.9–10.3)
Chloride: 101 mmol/L (ref 98–111)
Creatinine, Ser: 0.8 mg/dL (ref 0.44–1.00)
GFR, Estimated: >60 mL/min (ref >60)
Glucose, Bld: 106 mg/dL — ABNORMAL HIGH (ref 70–99)
Potassium: 3.3 mmol/L — ABNORMAL LOW (ref 3.5–5.1)
Sodium: 140 mmol/L (ref 135–145)
Total Bilirubin: 0.4 mg/dL (ref 0.0–1.2)
Total Protein: 7.1 g/dL (ref 6.5–8.1)

## 2023-12-06 LAB — TROPONIN I (HIGH SENSITIVITY)
Troponin I (High Sensitivity): 3 ng/L (ref <18)
Troponin I (High Sensitivity): 4 ng/L (ref <18)

## 2023-12-06 LAB — CBG MONITORING, ED: Glucose-Capillary: 117 mg/dL — ABNORMAL HIGH (ref 70–99)

## 2023-12-06 MED ORDER — METOCLOPRAMIDE HCL 5 MG/ML IJ SOLN: 10.0000 mg | Freq: Once | INTRAMUSCULAR | Status: DC

## 2023-12-06 MED ORDER — DIPHENHYDRAMINE HCL 25 MG PO CAPS
25.0000 mg | ORAL_CAPSULE | Freq: Once | ORAL | Status: DC
  Filled 2023-12-06: qty 1

## 2023-12-06 MED ORDER — METOCLOPRAMIDE HCL 10 MG PO TABS
10.0000 mg | ORAL_TABLET | Freq: Once | ORAL | Status: AC
  Administered 2023-12-06: 10 mg via ORAL
  Filled 2023-12-06: qty 1

## 2023-12-06 MED ORDER — LACTATED RINGERS IV BOLUS
1000.0000 mL | Freq: Once | INTRAVENOUS | Status: AC
  Administered 2023-12-06: 1000 mL via INTRAVENOUS

## 2023-12-06 MED ORDER — ACETAMINOPHEN 500 MG PO TABS
1000.0000 mg | ORAL_TABLET | Freq: Once | ORAL | Status: AC
  Administered 2023-12-06: 1000 mg via ORAL
  Filled 2023-12-06: qty 2

## 2023-12-06 NOTE — ED Provider Notes (Incomplete)
 [place] EMERGENCY DEPARTMENT AT [facility] Provider Note   CSN: [id number] Arrival date & time: 12/06/23  1631     Patient presents with: Syncopal Episode and Possible Seizure   Tanya Gonzalez is a 48 y.o. female.  {Add pertinent medical, surgical, social history, OB history to HPI:32947} HPI          Prior to Admission medications   Medication Sig Start Date End Date Taking? Authorizing Provider  ibuprofen  (ADVIL ) 600 MG tablet Take 1 tablet (600 mg total) by mouth every 6 (six) hours as needed. 05/12/23   [name], [name]  levothyroxine (SYNTHROID, LEVOTHROID) 100 MCG tablet Take 100 mcg by mouth daily before breakfast.    [provider]    Allergies: Ciprofloxacin and Codeine    Review of Systems  Updated Vital Signs BP 118/78 (BP Location: Right Arm)   Pulse 76   Temp 98.5 F (36.9 C)   Resp 18   Ht 5' 2 (1.575 m)   Wt 53.1 kg   SpO2 98%   BMI 21.40 kg/m   Physical Exam  (all labs ordered are listed, but only abnormal results are displayed) Labs Reviewed  CBC WITH DIFFERENTIAL/PLATELET - Abnormal; Notable for the following components:      Result Value   WBC 12.5 (*)    Neutro Abs 9.7 (*)    All other components within normal limits  COMPREHENSIVE METABOLIC PANEL WITH GFR - Abnormal; Notable for the following components:   Potassium 3.3 (*)    Glucose, Bld 106 (*)    ALT 46 (*)    All other components within normal limits  CBG MONITORING, ED - Abnormal; Notable for the following components:   Glucose-Capillary 117 (*)    All other components within normal limits  I-STAT CHEM 8, ED - Abnormal; Notable for the following components:   Glucose, Bld 104 (*)    Calcium, Ion 1.14 (*)    Hemoglobin 15.3 (*)    All other components within normal limits  TROPONIN I (HIGH SENSITIVITY)  TROPONIN I (HIGH SENSITIVITY)    EKG: None  Radiology: DG Chest 2 View Result Date: 12/06/2023 CLINICAL DATA:  Shortness of breath  EXAM: CHEST - 2 VIEW COMPARISON:  07/15/2021 FINDINGS: The heart size and mediastinal contours are within normal limits. Both lungs are clear. The visualized skeletal structures are unremarkable. Tubular artifacts over the right chest. IMPRESSION: No active cardiopulmonary disease. Electronically Signed   By: Luke Bun M.D.   On: 12/06/2023 17:21   CT Head Wo Contrast Result Date: 12/06/2023 EXAM: CT HEAD WITHOUT CONTRAST 12/06/2023 05:04:00 PM TECHNIQUE: CT of the head was performed without the administration of intravenous contrast. Automated exposure control, iterative reconstruction, and/or weight based adjustment of the mA/kV was utilized to reduce the radiation dose to as low as reasonably achievable. COMPARISON: None available. CLINICAL HISTORY: LOC and headache. Table formatting from the original note was not included. Patient here after she had a syncopal episode yesterday. It lasted about 5-6 seconds. Witness reports that she was pale and was shaking. PCP sent her for further testing here in the ED. FINDINGS: BRAIN AND VENTRICLES: No acute hemorrhage. No evidence of acute infarct. No hydrocephalus. No extra-axial collection. No mass effect or midline shift. ORBITS: No acute abnormality. SINUSES: No acute abnormality. SOFT TISSUES AND SKULL: No acute soft tissue abnormality. No skull fracture. IMPRESSION: 1. No acute intracranial abnormality. Electronically signed by: Norman Gatlin MD 12/06/2023 05:15 PM EDT RP Workstation: [id number]    {  Document cardiac monitor, telemetry assessment procedure when appropriate:32947} Procedures   Medications Ordered in the ED  diphenhydrAMINE (BENADRYL) capsule 25 mg (25 mg Oral Not Given 12/06/23 2303)  acetaminophen  (TYLENOL ) tablet 1,000 mg (1,000 mg Oral Given 12/06/23 2147)  lactated ringers  bolus 1,000 mL (1,000 mLs Intravenous New Bag/Given 12/06/23 2247)  metoCLOPramide (REGLAN) tablet 10 mg (10 mg Oral Given 12/06/23 2247)      {Click here  for ABCD2, HEART and other calculators REFRESH Note before signing:1}                              Medical Decision Making Risk OTC drugs. Prescription drug management.   ***  {Document critical care time when appropriate  Document review of labs and clinical decision tools ie CHADS2VASC2, etc  Document your independent review of radiology images and any outside records  Document your discussion with family members, caretakers and with consultants  Document social determinants of health affecting pt's care  Document your decision making why or why not admission, treatments were needed:32947:::1}   Final diagnoses:  None    ED Discharge Orders     None

## 2023-12-06 NOTE — ED Provider Notes (Signed)
 [place] EMERGENCY DEPARTMENT AT [facility] Provider Note   CSN: [id number] Arrival date & time: 12/06/23  1631     Patient presents with: Syncopal Episode and Possible Seizure   Tanya Gonzalez is a 48 y.o. female.   HPI  Patient is a 48 year old female with past medical history significant for interstitial cystitis, fibromyalgia, migraines, hypothyroidism on Synthroid  She presents emergency room today after a syncopal episode yesterday and states that she also had a syncopal episode today while at PCP office and was sent to the emergency room.  She denies any chest pain or difficulty breathing she states both times she felt lightheaded.  She states she is significantly dehydrated does not drink very much water .  She also had an episode this morning where she woke up and felt confused and had urinated.  No witnessed seizure activity no tongue biting.  She states that yesterday when she passed out she felt lightheaded got tunnel vision and fell to the ground she states her right hip and right side hurt initially but seems to be feeling better today.  She has not had any chest pain difficulty breathing or shortness of breath.      Prior to Admission medications   Medication Sig Start Date End Date Taking? Authorizing Provider  ibuprofen  (ADVIL ) 600 MG tablet Take 1 tablet (600 mg total) by mouth every 6 (six) hours as needed. 05/12/23   [name], [name]  levothyroxine (SYNTHROID, LEVOTHROID) 100 MCG tablet Take 100 mcg by mouth daily before breakfast.    [provider]    Allergies: Ciprofloxacin and Codeine    Review of Systems  Updated Vital Signs BP 118/78 (BP Location: Right Arm)   Pulse 76   Temp 98.5 F (36.9 C)   Resp 18   Ht 5' 2 (1.575 m)   Wt 53.1 kg   SpO2 98%   BMI 21.40 kg/m   Physical Exam Vitals and nursing note reviewed.  Constitutional:      General: She is not in acute distress. HENT:     Head: Normocephalic and  atraumatic.     Nose: Nose normal.     Mouth/Throat:     Mouth: Mucous membranes are dry.  Eyes:     General: No scleral icterus. Cardiovascular:     Rate and Rhythm: Normal rate and regular rhythm.     Pulses: Normal pulses.     Heart sounds: Normal heart sounds.  Pulmonary:     Effort: Pulmonary effort is normal. No respiratory distress.     Breath sounds: No wheezing.  Abdominal:     Palpations: Abdomen is soft.     Tenderness: There is no abdominal tenderness.  Musculoskeletal:     Cervical back: Normal range of motion.     Right lower leg: No edema.     Left lower leg: No edema.     Comments: Small bruise to right flank and right hip no bony tenderness.  No guarding or rebound of abdomen  Skin:    General: Skin is warm and dry.     Capillary Refill: Capillary refill takes less than 2 seconds.  Neurological:     Mental Status: She is alert. Mental status is at baseline.     Comments: Alert and oriented to self, place, time and event.   Speech is fluent, clear without dysarthria or dysphasia.   Strength 5/5 in upper/lower extremities   Sensation intact in upper/lower extremities    CN  I not tested  CN II grossly intact visual fields bilaterally. Did not visualize posterior eye.  CN III, IV, VI PERRLA and EOMs intact bilaterally  CN V Intact sensation to sharp and light touch to the face  CN VII facial movements symmetric  CN VIII not tested  CN IX, X no uvula deviation, symmetric rise of soft palate  CN XI 5/5 SCM and trapezius strength bilaterally  CN XII Midline tongue protrusion, symmetric L/R movements     Psychiatric:        Mood and Affect: Mood normal.        Behavior: Behavior normal.     (all labs ordered are listed, but only abnormal results are displayed) Labs Reviewed  CBC WITH DIFFERENTIAL/PLATELET - Abnormal; Notable for the following components:      Result Value   WBC 12.5 (*)    Neutro Abs 9.7 (*)    All other components within normal limits   COMPREHENSIVE METABOLIC PANEL WITH GFR - Abnormal; Notable for the following components:   Potassium 3.3 (*)    Glucose, Bld 106 (*)    ALT 46 (*)    All other components within normal limits  CBG MONITORING, ED - Abnormal; Notable for the following components:   Glucose-Capillary 117 (*)    All other components within normal limits  I-STAT CHEM 8, ED - Abnormal; Notable for the following components:   Glucose, Bld 104 (*)    Calcium, Ion 1.14 (*)    Hemoglobin 15.3 (*)    All other components within normal limits  TROPONIN I (HIGH SENSITIVITY)  TROPONIN I (HIGH SENSITIVITY)    EKG: None  Radiology: DG Chest 2 View Result Date: 12/06/2023 CLINICAL DATA:  Shortness of breath EXAM: CHEST - 2 VIEW COMPARISON:  07/15/2021 FINDINGS: The heart size and mediastinal contours are within normal limits. Both lungs are clear. The visualized skeletal structures are unremarkable. Tubular artifacts over the right chest. IMPRESSION: No active cardiopulmonary disease. Electronically Signed   By: Luke Bun M.D.   On: 12/06/2023 17:21   CT Head Wo Contrast Result Date: 12/06/2023 EXAM: CT HEAD WITHOUT CONTRAST 12/06/2023 05:04:00 PM TECHNIQUE: CT of the head was performed without the administration of intravenous contrast. Automated exposure control, iterative reconstruction, and/or weight based adjustment of the mA/kV was utilized to reduce the radiation dose to as low as reasonably achievable. COMPARISON: None available. CLINICAL HISTORY: LOC and headache. Table formatting from the original note was not included. Patient here after she had a syncopal episode yesterday. It lasted about 5-6 seconds. Witness reports that she was pale and was shaking. PCP sent her for further testing here in the ED. FINDINGS: BRAIN AND VENTRICLES: No acute hemorrhage. No evidence of acute infarct. No hydrocephalus. No extra-axial collection. No mass effect or midline shift. ORBITS: No acute abnormality. SINUSES: No acute  abnormality. SOFT TISSUES AND SKULL: No acute soft tissue abnormality. No skull fracture. IMPRESSION: 1. No acute intracranial abnormality. Electronically signed by: Norman Gatlin MD 12/06/2023 05:15 PM EDT RP Workstation: [id number]     Procedures   Medications Ordered in the ED  diphenhydrAMINE (BENADRYL) capsule 25 mg (25 mg Oral Not Given 12/06/23 2303)  acetaminophen  (TYLENOL ) tablet 1,000 mg (1,000 mg Oral Given 12/06/23 2147)  lactated ringers  bolus 1,000 mL (1,000 mLs Intravenous New Bag/Given 12/06/23 2247)  metoCLOPramide (REGLAN) tablet 10 mg (10 mg Oral Given 12/06/23 2247)  Medical Decision Making Risk OTC drugs. Prescription drug management.   This patient presents to the ED for concern of syncope, this involves a number of treatment options, and is a complaint that carries with it a mod/high risk of complications and morbidity. A differential diagnosis was considered for the patient's symptoms which is discussed below:   The differential diagnosis for syncope includes (but is not limited to) anemia, hypoglycemia, arrhythmia, seizure.  If associated with pain loss of consciousness can certainly also include subarachnoid hemorrhage, thoracic aortic dissection, PE, ectopic pregnancy, AAA.  Also with considering is a vasovagal reaction, hypovolemia/dehydration.    Co morbidities: Discussed in HPI   Brief History:  Patient is a 48 year old female with past medical history significant for interstitial cystitis, fibromyalgia, migraines, hypothyroidism on Synthroid  She presents emergency room today after a syncopal episode yesterday and states that she also had a syncopal episode today while at PCP office and was sent to the emergency room.  She denies any chest pain or difficulty breathing she states both times she felt lightheaded.  She states she is significantly dehydrated does not drink very much water .  She also had an episode this  morning where she woke up and felt confused and had urinated.  No witnessed seizure activity no tongue biting.  She states that yesterday when she passed out she felt lightheaded got tunnel vision and fell to the ground she states her right hip and right side hurt initially but seems to be feeling better today.  She has not had any chest pain difficulty breathing or shortness of breath.    EMR reviewed including pt PMHx, past surgical history and past visits to ER.   See HPI for more details   Lab Tests:   Labs with mild leukocytosis of 12.5 CMP unremarkable troponin normal, no anemia on CBC   Imaging Studies:  NAD. I personally reviewed all imaging studies and no acute abnormality found. I agree with radiology interpretation. Chest x-ray and CT head unremarkable   Cardiac Monitoring:  The patient was maintained on a cardiac monitor.  I personally viewed and interpreted the cardiac monitored which showed an underlying rhythm of: NSR EKG non-ischemic abnormal EKG   Medicines ordered:  I ordered medication including Reglan, Tylenol , LR for hydration and headache Reevaluation of the patient after these medicines showed that the patient resolved I have reviewed the patients home medicines and have made adjustments as needed   Critical Interventions:     Consults/Attending Physician      Reevaluation:  After the interventions noted above I re-evaluated patient and found that they have :resolved   Social Determinants of Health:      Problem List / ED Course:  I offered patient admission given that she has had multiple episodes of syncope.  She declines this.  I discussed with patient that it is difficult to give return precautions for syncope given that this may be cardiac in nature.  She certainly does not have a completely normal EKG however has not had any chest pain seems that she is quite dehydrated had dry oromucosa and states that she is not drinking much  water  had prodromal symptoms of lightheadedness with her episodes.  I recommend that she do not drive and follow-up with cardiology and PCP may benefit from neurology consultation as well.   Dispostion:  After consideration of the diagnostic results and the patients response to treatment, I feel that the patent would benefit from outpatient follow-up as she declines  inpatient telemetry admission.  Final diagnoses:  Syncope, unspecified syncope type  Dehydration    ED Discharge Orders          Ordered    Ambulatory referral to Cardiology       Comments: If you have not heard from the Cardiology office within the next 72 hours please call [phone].   12/06/23 2355               Neldon Hamp RAMAN, PA 12/07/23 0010    Mannie Pac T, DO 12/07/23 2228

## 2023-12-06 NOTE — Discharge Instructions (Signed)
 As discussed, I am happy to admit you to the hospital for monitoring given that you have passed out several times a day however given your preference for being discharged home I recommend that you hydrate, follow-up with cardiology and return immediately to the emergency room if you pass out again or have any other new or concerning symptoms.

## 2023-12-06 NOTE — ED Triage Notes (Signed)
 Patient here after she had a syncopal episode yesterday. It lasted about 5-6 seconds. Witness reports that she was pale and was shaking. PCP sent her for further testing here in the ED.

## 2023-12-06 NOTE — ED Notes (Signed)
 Extra Red & DG tube drawn

## 2023-12-06 NOTE — ED Triage Notes (Signed)
 Pt came in via POV d/t having a syncopal episode yesterday & when she went to her PCP today she had another syncopal episode after feeling dizzy then fell & hit her head on the Rt side. She was then told her heart rate was abnormal & she was also treated for n/v. Pt also reports she may have had a seizure because earlier today she woke up realizing that she was very diaphoretic & had urinated on herself. Denies Hx of seizures, pt reports she is anemic & has passed out from her thyroid  in the past.

## 2023-12-06 NOTE — ED Provider Triage Note (Signed)
 Emergency Medicine Provider Triage Evaluation Note  Tanya Gonzalez , a 48 y.o. female  was evaluated in triage.  Pt complains of headache, right-sided rib pain with associated shortness of breath after a syncopal episode yesterday.  She also states she still feels out of it.  Patient reports she was at work when she became flushed and lost consciousness.  She reports hitting her head, right side of her ribs and right hip on the ground.  She also states she had another syncopal episode this morning with questionable seizure activity.  She does report incontinence as well as feeling flushed.  Neuroexam intact.  She was evaluated in the emergency department yesterday and discharged.  This morning she was evaluated by her PCP who advised her to come to the ER for further evaluation.  Review of Systems  Positive: Headache, pleuritic chest pain, hip pain, nausea Negative: Visual changes  Physical Exam  BP 135/77 (BP Location: Right Arm)   Pulse 64   Temp 97.8 F (36.6 C)   Resp 16   SpO2 99%  Gen:   Awake, no distress   Resp:  Normal effort  MSK:   Moves extremities without difficulty.  Bilateral upper and lower extremity strength intact.  Cranial nerve III through XII grossly intact.  No facial droop.  Speech normal. Other:    Medical Decision Making  Medically screening exam initiated at 4:50 PM.  Appropriate orders placed.  Tanya Gonzalez was informed that the remainder of the evaluation will be completed by another provider, this initial triage assessment does not replace that evaluation, and the importance of remaining in the ED until their evaluation is complete.  Patient is PERC negative.  No D-dimer ordered at this time.   Tanya Marry RAMAN, [name] 12/06/23 1655

## 2023-12-06 NOTE — ED Notes (Signed)
 Face-to-face Verbal Order from Lyons PA for 1g tylenol  placed

## 2023-12-19 ENCOUNTER — Encounter (HOSPITAL_COMMUNITY): Payer: Self-pay

## 2023-12-19 ENCOUNTER — Emergency Department (HOSPITAL_COMMUNITY)
Admission: EM | Admit: 2023-12-19 | Discharge: 2023-12-19 | Disposition: A | Discharge status: Home / Self Care | Payer: Worker's Compensation | Attending: Emergency Medicine | Admitting: Emergency Medicine

## 2023-12-19 ENCOUNTER — Emergency Department (HOSPITAL_COMMUNITY): Payer: Self-pay | Attending: Nurse Practitioner

## 2023-12-19 ENCOUNTER — Other Ambulatory Visit: Payer: Self-pay

## 2023-12-19 DIAGNOSIS — R079 Chest pain, unspecified: Secondary | ICD-10-CM

## 2023-12-19 DIAGNOSIS — R911 Solitary pulmonary nodule: Secondary | ICD-10-CM

## 2023-12-19 DIAGNOSIS — Z79899 Other long term (current) drug therapy: Secondary | ICD-10-CM | POA: Insufficient documentation

## 2023-12-19 DIAGNOSIS — E039 Hypothyroidism, unspecified: Secondary | ICD-10-CM | POA: Diagnosis not present

## 2023-12-19 DIAGNOSIS — R059 Cough, unspecified: Secondary | ICD-10-CM | POA: Diagnosis not present

## 2023-12-19 DIAGNOSIS — F419 Anxiety disorder, unspecified: Secondary | ICD-10-CM

## 2023-12-19 DIAGNOSIS — R0789 Other chest pain: Secondary | ICD-10-CM | POA: Diagnosis present

## 2023-12-19 LAB — COMPREHENSIVE METABOLIC PANEL WITH GFR
ALT: 23 U/L (ref 0–44)
AST: 23 U/L (ref 15–41)
Albumin: 4.8 g/dL (ref 3.5–5.0)
Alkaline Phosphatase: 103 U/L (ref 38–126)
Anion gap: 13 (ref 5–15)
BUN: 20 mg/dL (ref 6–20)
CO2: 26 mmol/L (ref 22–32)
Calcium: 9.8 mg/dL (ref 8.9–10.3)
Chloride: 103 mmol/L (ref 98–111)
Creatinine, Ser: 0.89 mg/dL (ref 0.44–1.00)
GFR, Estimated: >60 mL/min (ref >60)
Glucose, Bld: 103 mg/dL — ABNORMAL HIGH (ref 70–99)
Potassium: 4.2 mmol/L (ref 3.5–5.1)
Sodium: 142 mmol/L (ref 135–145)
Total Bilirubin: 0.3 mg/dL (ref 0.0–1.2)
Total Protein: 7.4 g/dL (ref 6.5–8.1)

## 2023-12-19 LAB — CBC WITH DIFFERENTIAL/PLATELET
Abs Immature Granulocytes: 0.02 K/uL (ref 0.00–0.07)
Basophils Absolute: 0.1 K/uL (ref 0.0–0.1)
Basophils Relative: 1 %
Eosinophils Absolute: 0.1 K/uL (ref 0.0–0.5)
Eosinophils Relative: 1 %
HCT: 43.2 % (ref 36.0–46.0)
Hemoglobin: 14.3 g/dL (ref 12.0–15.0)
Immature Granulocytes: 0 %
Lymphocytes Relative: 29 %
Lymphs Abs: 3.1 K/uL (ref 0.7–4.0)
MCH: 32.1 pg (ref 26.0–34.0)
MCHC: 33.1 g/dL (ref 30.0–36.0)
MCV: 96.9 fL (ref 80.0–100.0)
Monocytes Absolute: 0.8 K/uL (ref 0.1–1.0)
Monocytes Relative: 8 %
Neutro Abs: 6.6 K/uL (ref 1.7–7.7)
Neutrophils Relative %: 61 %
Platelets: 312 K/uL (ref 150–400)
RBC: 4.46 MIL/uL (ref 3.87–5.11)
RDW: 12.9 % (ref 11.5–15.5)
WBC: 10.7 K/uL — ABNORMAL HIGH (ref 4.0–10.5)
nRBC: 0 % (ref 0.0–0.2)

## 2023-12-19 LAB — TROPONIN T, HIGH SENSITIVITY
Troponin T High Sensitivity: <15 ng/L (ref 0–19)
Troponin T High Sensitivity: <15 ng/L (ref 0–19)

## 2023-12-19 MED ORDER — NAPROXEN 500 MG PO TABS
500.0000 mg | ORAL_TABLET | Freq: Two times a day (BID) | ORAL | 0 refills | Status: AC
Start: 2023-12-19 — End: ?

## 2023-12-19 MED ORDER — KETOROLAC TROMETHAMINE 15 MG/ML IJ SOLN
15.0000 mg | Freq: Once | INTRAMUSCULAR | Status: AC
  Administered 2023-12-19: 15 mg via INTRAVENOUS
  Filled 2023-12-19: qty 1

## 2023-12-19 MED ORDER — IOHEXOL 350 MG/ML SOLN
75.0000 mL | Freq: Once | INTRAVENOUS | Status: AC | PRN
  Administered 2023-12-19: 75 mL via INTRAVENOUS

## 2023-12-19 MED ORDER — HYDROCODONE-ACETAMINOPHEN 5-325 MG PO TABS: 1.0000 | ORAL_TABLET | ORAL | 0 refills | Status: AC | PRN

## 2023-12-19 MED ORDER — HYDROXYZINE HCL 25 MG PO TABS: 25.0000 mg | ORAL_TABLET | Freq: Four times a day (QID) | ORAL | 0 refills | Status: AC | PRN

## 2023-12-19 MED ORDER — ACETAMINOPHEN 500 MG PO TABS
1000.0000 mg | ORAL_TABLET | Freq: Once | ORAL | Status: AC
  Administered 2023-12-19: 1000 mg via ORAL
  Filled 2023-12-19: qty 2

## 2023-12-19 MED ORDER — LIDOCAINE 5 % EX PTCH: 1.0000 | MEDICATED_PATCH | CUTANEOUS | 0 refills | Status: AC

## 2023-12-19 NOTE — ED Triage Notes (Signed)
 Pt arrived via POV c/o on-going pain from a syncopal episode and fall 2 weeks ago. Pt reports the bruising and pain have persisted, and Pt reports she has not had any recurrent episodes since being discharged.

## 2023-12-19 NOTE — Discharge Instructions (Addendum)
 You are seen the ER today for evaluation of continued chest pain after a fall.  This seems to be related to a contusion.  Your blood work and EKG were reassuring, no sign of heart attack, your CT scan did not show evidence of a blood clot, pneumonia, any broken ribs.  He did have a pulmonary nodule which will need to be reevaluated with by another CT in 1 year.  Please have your primary care doctor follow-up on this.

## 2023-12-19 NOTE — ED Provider Notes (Signed)
 [name]   CSN: [id number] Arrival date & time: 12/19/23  1034     Patient presents with: Tanya Gonzalez is a 48 y.o. female.  History of GERD, hypothyroidism, migraines.  She presents to ER today complaining of right anterior chest pain x 2 weeks since she had a fall.  She states she passed out a couple of times and hit her head and chest.  She was in the ER and was given IV fluids, discharged home and states she has not had any further episodes of passing out was having persistent right sided chest pain, states she has a cough productive of white sputum occasionally and has pain with coughing and breathing and feels like her symptoms are getting worse.  She denies leg swelling or swelling, she is not on hormonal therapies.    Fall       Prior to Admission medications   Medication Sig Start Date End Date Taking? Authorizing Provider  ibuprofen  (ADVIL ) 600 MG tablet Take 1 tablet (600 mg total) by mouth every 6 (six) hours as needed. 05/12/23   Tanya Gonzalez A, [name]  levothyroxine (SYNTHROID, LEVOTHROID) 100 MCG tablet Take 100 mcg by mouth daily before breakfast.    [provider]    Allergies: Ciprofloxacin and Codeine    Review of Systems  Updated Vital Signs BP 132/84 (BP Location: Right Arm)   Pulse 84   Temp 98.5 F (36.9 C) (Temporal)   Resp 18   Ht 5' 2 (1.575 m)   Wt 53 kg   SpO2 100%   BMI 21.37 kg/m   Physical Exam  (all labs ordered are listed, but only abnormal results are displayed) Labs Reviewed  CBC WITH DIFFERENTIAL/PLATELET  COMPREHENSIVE METABOLIC PANEL WITH GFR  TROPONIN T, HIGH SENSITIVITY    EKG: None  Radiology: No results found.   Procedures   Medications Ordered in the ED - No data to display                                  Medical Decision Making Differential diagnosis includes but is not limited to ACS, PE, pneumonia, rib fracture, chest wall  contusion, costochondritis, other  ED course: Patient presents to the ER for evaluation of continued chest pain that is reproducible on palpation on the right side of her chest since she did fall 2 weeks ago.  She had passed out at that time.  She feels she injured her chest when she fell.  She has had no further syncope or dizziness but states she has had continued pain.  She was given Toradol  here with mild relief of her symptoms.  Her workup was reassuring.  No ischemic findings on EKG shows sinus rhythm.  A PE study was ordered due to continued pain and prior syncope and this was negative as well.   Patient also reports she is having a lot of anxiety since the fall, she has had panic attacks in the past but not recently.  She states she used to take Valium for the use but does not have a prescription for this now. I have prescribed prescription for Atarax and patient to follow-up with PCP for anxiety Given prescription for analgesics for home.  She was given strict return precautions.  Amount and/or Complexity of Data Reviewed External Data Reviewed: labs, radiology and notes.    Details:  Recent ED visit for syncope and fall Labs: ordered.    Details: Troponin negative, CBC CMP reassuring Radiology: ordered and independent interpretation performed.    Details: CT angio chest which shows no PE, no fracture, no pneumonia per my interpretation, agree with radiology reading, they also note a finding of incidental pulmonary nodule  Risk OTC drugs. Prescription drug management.        Final diagnoses:  None    ED Discharge Orders     None          Tanya Gonzalez 12/19/23 1944    Tanya Charleston, MD 12/20/23 [phone]

## 2023-12-28 ENCOUNTER — Ambulatory Visit: Payer: Self-pay | Admitting: Nurse Practitioner

## 2024-02-04 ENCOUNTER — Ambulatory Visit

## 2024-02-04 ENCOUNTER — Ambulatory Visit: Payer: Self-pay

## 2024-02-04 NOTE — Progress Notes (Signed)
 Read nurse triage note from E2C2. Patient was added on as a new patient to establish primary care in a 20 minute appointment slot and was also having complaints of severe headache,  chest pain, SOB, vision changes, and white frothy sputum cough.   Based on triage note, patient should have been sent to the ER to rule out MI, PE, stroke, etc. Patient's described symptoms warrant emergency care and waiting until appointment with me in the afternoon could delay potential emergent care the patient may require.   Patient was called and advised that these symptoms are not appropriate to be managed or worked up in out patient primary care and ED was advised.   If patient is interested in establishing primary care at [facility], I am still happy to see her sometime after work up in the ED.  Appt for 02/04/24 at 3:10 was cancelled and patient was advised to go to ER.   Saddie JULIANNA Sacks, [name]

## 2024-02-04 NOTE — Telephone Encounter (Signed)
 FYI Only or Action Required?: FYI only for provider: appointment scheduled on 02/04/2024.  Patient was last seen in primary care on n/a.  Called Nurse Triage reporting Heart Problem.  Symptoms began ongoing since November.  Interventions attempted: Nothing.  Symptoms are: gradually worsening.  Triage Disposition: See PCP When Office is Open (Within 3 Days)  Patient/caregiver understands and will follow disposition?: Yes   Copied from CRM #[id number]. Topic: Clinical - Red Word Triage >> Feb 04, 2024  9:51 AM Donna BRAVO wrote: Red Word that prompted transfer to Nurse Triage:  heart rate over the past few weeks   61 to 115 -101 heart rate this morning 02/03/24 -Bad headache -blurry vision unable to focus -BP 02/04/24 at 9:51  109/79 pulse 93 -not feeling right Reason for Disposition  History of hyperthyroidism or taking thyroid  medication  Answer Assessment - Initial Assessment Questions 1. DESCRIPTION: Please describe your heart rate or heartbeat that you are having (e.g., fast/slow, regular/irregular, skipped or extra beats, palpitations)     Heart palpitations 2. ONSET: When did it start? (e.g., minutes, hours, days)      01/12/2024 3. DURATION: How long does it last (e.g., seconds, minutes, hours)     unsure 4. PATTERN Does it come and go, or has it been constant since it started?  Does it get worse with exertion?   Are you feeling it now?     constant 5. TAP: Using your hand, can you tap out what you are feeling on a chair or table in front of you, so that I can hear? Note: Not all patients can do this.       na 6. HEART RATE: Can you tell me your heart rate? How many beats in 15 seconds?  Note: Not all patients can do this.       na 7. RECURRENT SYMPTOM: Have you ever had this before? If Yes, ask: When was the last time? and What happened that time?      yes 8. CAUSE: What do you think is causing the palpitations?     Heart arrhythmia 9.  CARDIAC HISTORY: Do you have any history of heart disease? (e.g., heart attack, angina, bypass surgery, angioplasty, arrhythmia)      Abnormal EKG 10. OTHER SYMPTOMS: Do you have any other symptoms? (e.g., dizziness, chest pain, sweating, difficulty breathing)       Dizziness, bad headache, blurry vision, weak, drained, keep passing out,  11. PREGNANCY: Is there any chance you are pregnant? When was your last menstrual period?       na  Protocols used: Heart Rate and Heartbeat Questions-A-AH

## 2024-02-11 ENCOUNTER — Other Ambulatory Visit: Payer: Self-pay

## 2024-02-11 ENCOUNTER — Emergency Department (HOSPITAL_COMMUNITY)

## 2024-02-11 ENCOUNTER — Encounter (HOSPITAL_COMMUNITY): Payer: Self-pay

## 2024-02-11 ENCOUNTER — Emergency Department (HOSPITAL_COMMUNITY)
Admission: EM | Admit: 2024-02-11 | Discharge: 2024-02-11 | Disposition: A | Discharge status: Home / Self Care | Attending: Emergency Medicine | Admitting: Emergency Medicine

## 2024-02-11 ENCOUNTER — Other Ambulatory Visit: Payer: Self-pay | Admitting: Student

## 2024-02-11 DIAGNOSIS — R55 Syncope and collapse: Secondary | ICD-10-CM | POA: Insufficient documentation

## 2024-02-11 DIAGNOSIS — E039 Hypothyroidism, unspecified: Secondary | ICD-10-CM | POA: Diagnosis not present

## 2024-02-11 DIAGNOSIS — F17210 Nicotine dependence, cigarettes, uncomplicated: Secondary | ICD-10-CM | POA: Diagnosis not present

## 2024-02-11 DIAGNOSIS — R002 Palpitations: Secondary | ICD-10-CM | POA: Insufficient documentation

## 2024-02-11 LAB — CBC
HCT: 38.8 % (ref 36.0–46.0)
Hemoglobin: 12.8 g/dL (ref 12.0–15.0)
MCH: 32.5 pg (ref 26.0–34.0)
MCHC: 33 g/dL (ref 30.0–36.0)
MCV: 98.5 fL (ref 80.0–100.0)
Platelets: 239 K/uL (ref 150–400)
RBC: 3.94 MIL/uL (ref 3.87–5.11)
RDW: 12.8 % (ref 11.5–15.5)
WBC: 10.1 K/uL (ref 4.0–10.5)
nRBC: 0 % (ref 0.0–0.2)

## 2024-02-11 LAB — BASIC METABOLIC PANEL WITH GFR
Anion gap: 8 (ref 5–15)
BUN: 20 mg/dL (ref 6–20)
CO2: 26 mmol/L (ref 22–32)
Calcium: 9.4 mg/dL (ref 8.9–10.3)
Chloride: 106 mmol/L (ref 98–111)
Creatinine, Ser: 0.76 mg/dL (ref 0.44–1.00)
GFR, Estimated: >60 mL/min
Glucose, Bld: 105 mg/dL — ABNORMAL HIGH (ref 70–99)
Potassium: 4.5 mmol/L (ref 3.5–5.1)
Sodium: 140 mmol/L (ref 135–145)

## 2024-02-11 LAB — MAGNESIUM: Magnesium: 2.1 mg/dL (ref 1.7–2.4)

## 2024-02-11 LAB — TROPONIN T, HIGH SENSITIVITY
Troponin T High Sensitivity: <15 ng/L (ref 0–19)
Troponin T High Sensitivity: <15 ng/L (ref 0–19)

## 2024-02-11 LAB — T4, FREE: Free T4: 1.41 ng/dL (ref 0.80–2.00)

## 2024-02-11 LAB — D-DIMER, QUANTITATIVE: D-Dimer, Quant: 0.41 ug{FEU}/mL (ref 0.00–0.50)

## 2024-02-11 LAB — TSH: TSH: 1.65 u[IU]/mL (ref 0.350–4.500)

## 2024-02-11 MED ORDER — KETOROLAC TROMETHAMINE 15 MG/ML IJ SOLN
15.0000 mg | Freq: Once | INTRAMUSCULAR | Status: AC
  Administered 2024-02-11: 15 mg via INTRAVENOUS
  Filled 2024-02-11: qty 1

## 2024-02-11 MED ORDER — SODIUM CHLORIDE 0.9 % IV BOLUS
1000.0000 mL | Freq: Once | INTRAVENOUS | Status: AC
  Administered 2024-02-11: 1000 mL via INTRAVENOUS

## 2024-02-11 NOTE — Progress Notes (Signed)
 Orders for a 14-day ZIO monitor.  Dr. Francyne to read.  Has a follow-up appointment scheduled on 04/01/2023.

## 2024-02-11 NOTE — ED Provider Notes (Signed)
 " [place] EMERGENCY DEPARTMENT AT [facility] Provider Note   HPI/ROS    History obtained from patient.  Tanya Gonzalez is a 48 y.o. 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  Patient presents today for chest pain/palpitations that she states started in October after for single palpable sewed.  Had 4 syncopal episodes in October into November.  Denies any preceding symptoms of any of the syncopal episodes, but states when she wakes up she is sweaty and does not feel well.  Denies any preceding chest pain, shortness breath, headaches, or abdominal pain to these events.  Does endorse feeling like her heart has had palpitations consistently since her first syncopal episode in October, and they transiently worsen but never completely resolved.  Denies any chest pain while at rest or that worsens on exertion, but does have discomfort when taking a big deep breath.  Endorses feelings of palpitations, but states is not truly chest pain.  Denies any fevers, chills, cough.  Denies any history of blood clots.  Denies any associated abdominal pain, nausea, vomiting, or diarrhea.  Denies getting diaphoretic or having nausea/emesis during episodes of chest pain/palpitations.  Also endorses a mild headache that has been going on since October as well.  Despite taking Tylenol , has not had much relief at this.  Denies any numbness or tingling in her extremities or difficulty ambulating.  States that when her glasses are off her vision seems like it is worse than it usually is, but states with her glasses on her vision is relatively normal.  MDM   I have reviewed the nursing documentation, vital signs, as well as the past medical history, surgical  history, family history, and social history.  Initial Assessment:  Patient hemodynamically stable on initial evaluation.  Very unclear etiology of these palpitations she is feeling at home.  Has been normal sinus rhythm here on the monitor throughout her stay.  Had no significant leukocytosis or anemia on CBC, so doubt symptomatic anemia as a cause of the syncope.  No significant electrolyte abnormalities or AKI on metabolic panel, so less likely to be electrolyte induced dysrhythmia.  Chest x-ray with no focal airspace disease, subdiaphragmatic air, or pneumothorax.    Given patient has had multiple episodes of syncope, could be cardiac syncope but patient denies any chest pain before.  Her getting sweaty and then immediately feeling better after syncope would be consistent with vasovagal, but still unclear at this time.  No prior history of AAA or significant abdominal pain before or after syncopal episodes, so less likely etiology.  Aortic dissection would be unlikely given she has had syncope for 2 months and chest pain since then.    Will obtain TSH and T4 given patient has known hypothyroidism.  Also obtain magnesium and D-dimer to rule out PE although this is an unlikely cause of syncope in her and overall.  Will obtain head CT given she has had persistent headaches for 2 months now.  Unlikely to be CVA/TIA given nonfocal neurologic exam and prolonged duration.  Could possibly be intracranial mass, but still unlikely.  Troponins obtained in first look unremarkable.  EKG here normal sinus rhythm with no obvious ischemia, dysrhythmia, or high-grade AV block.  RSR' in V1 and V2.  Will give fluids and Toradol  for headache  as well.  Delta troponins unremarkable.  TSH, mag, and D-dimer within normal limits.  Free T4 normal as well.  Unlikely PE given normal D-dimer.  Also unlikely acute aortic syndrome.  Otherwise labs largely insignificant.  Given unremarkable workup thus far feel patient stable for  discharge.  Heart score of 2.  Did send home with cardiology referral and cardiology plans to send her a Zio patch.  They plan to follow-up with her in the outpatient setting.  She also plans follow-up with her PCP in the next week or 2.  Please return to the emergency department for any further syncopal episodes or any other concerns.  Disposition:  I discussed the plan for discharge with the patient and/or their surrogate at bedside prior to discharge and they were in agreement with the plan and verbalized understanding of the return precautions provided. All questions answered to the best of my ability. Ultimately, the patient was discharged in stable condition with stable vital signs. I am reassured that they are capable of close follow up and good social support at home.     This patient was staffed with Dr. Dean who supervised the visit and agreed with the plan of care.   Due to the patients current presenting symptoms, physical exam findings, and the workup stated above, it is thought that the etiology of the patients current presentation is:  1. Syncope, unspecified syncope type   2. Palpitations      Clinical Complexity A medically appropriate history, review of systems, and physical exam was performed.  Factors that affect the complexity of this encounter: assessment of correct protocol, laboratory work from this visit, and review of echocardiogram/EKG results  My independent interpretations of diagnostic studies are documented in the ED course above.   If decision rules were used in this patient's evaluation, they are listed below.   Click here for ABCD2, HEART and other calculators  Patient's presentation is most consistent with acute illness / injury with system symptoms.  MDM generated using voice dictation software and may contain dictation errors. Please contact me for any clarification or with any questions.    Physical Exam, PMH, PSH, Family History, and Social  Hsitory   Vitals:   02/11/24 1119 02/11/24 1125 02/11/24 1826 02/11/24 1918  BP:  121/84 135/76   Pulse:  73 (!) 56   Resp:  17 (!) 22   Temp:  98.6 F (37 C)  98.3 F (36.8 C)  TempSrc:  Oral  Oral  SpO2:  97% 100%   Weight: 54.4 kg     Height: 5' 2 (1.575 m)       Physical Exam Constitutional:      Appearance: She is well-developed.  HENT:     Head: Normocephalic and atraumatic.  Eyes:     Extraocular Movements: Extraocular movements intact.     Pupils: Pupils are equal, round, and reactive to light.  Cardiovascular:     Rate and Rhythm: Normal rate and regular rhythm.     Pulses:          Radial pulses are 2+ on the right side and 2+ on the left side.       Dorsalis pedis pulses are 2+ on the right side and 2+ on the left side.  Pulmonary:     Effort: No tachypnea or respiratory distress.     Breath sounds: No decreased breath sounds, wheezing or rhonchi.  Chest:     Chest wall: No tenderness.  Abdominal:  Palpations: Abdomen is soft.     Tenderness: There is no abdominal tenderness. There is no guarding or rebound.  Musculoskeletal:     Right lower leg: No edema.     Left lower leg: No edema.  Skin:    General: Skin is warm and dry.     Capillary Refill: Capillary refill takes less than 2 seconds.  Neurological:     General: No focal deficit present.     Mental Status: She is alert and oriented to person, place, and time.     Past Medical History:  Diagnosis Date   Carpal tunnel syndrome of right wrist 10/2012   Fibromyalgia    GERD (gastroesophageal reflux disease)    Hypothyroidism    IBS (irritable bowel syndrome)    IC (interstitial cystitis)    Migraines    Self-catheterizes urinary bladder    prn     Past Surgical History:  Procedure Laterality Date   CARPAL TUNNEL RELEASE Right 10/29/2012   Procedure: CARPAL TUNNEL RELEASE;  Surgeon: Arley JONELLE Curia, MD;  Location: [facility];  Service: Orthopedics;  Laterality: Right;    CHOLECYSTECTOMY     COLONOSCOPY  11/09/2011       CYSTOSCOPY  01/27/2010   with bladder hydrodistention   ESOPHAGOGASTRODUODENOSCOPY  01/2010   LAPAROSCOPIC SALPINGOOPHERECTOMY Right 01/22/2003   SALPINGOOPHORECTOMY Left 10/24/2001   VAGINAL HYSTERECTOMY  10/24/2001     Family History  Problem Relation Age of Onset   Crohn's disease Maternal Grandmother    Cervical cancer Maternal Aunt    Ovarian cancer Maternal Aunt     Social History   Tobacco Use   Smoking status: Every Day    Current packs/day: 0.50    Average packs/day: 0.5 packs/day for 15.0 years (7.5 ttl pk-yrs)    Types: Cigarettes    Passive exposure: Current   Smokeless tobacco: Never  Substance Use Topics   Alcohol use: Yes    Comment: rarely     Procedures   If procedures were preformed on this patient, they are listed below:  Procedures   Electronically signed by:   Glendia Carlin Ancona, M.D. PGY-2, Emergency Medicine   Please note that this documentation was produced with the assistance of voice-to-text technology and may contain errors.    Ancona Glendia, MD 02/12/24 [id number]  "

## 2024-02-11 NOTE — ED Provider Triage Note (Signed)
 Emergency Medicine Provider Triage Evaluation Note  Tanya Gonzalez , a 48 y.o. female  was evaluated in triage.  Pt complains of CP and palpitations starting this AM. Has had this in the past and was worked up with PE study 81month ago, negative.   Review of Systems  Positive: CP Negative: SOB  Physical Exam  Ht 5' 2 (1.575 m)   Wt 54.4 kg   BMI 21.95 kg/m  Gen:   Awake, no distress   Resp:  Normal effort  MSK:   Moves extremities without difficulty  Other:    Medical Decision Making  Medically screening exam initiated at 11:20 AM.  Appropriate orders placed.  Tanya Gonzalez was informed that the remainder of the evaluation will be completed by another provider, this initial triage assessment does not replace that evaluation, and the importance of remaining in the ED until their evaluation is complete.     Shermon Warren SAILOR, [name] 02/11/24 1121

## 2024-02-11 NOTE — ED Triage Notes (Signed)
 Patient reports ongoing palpitations and weird rhythms since 2024-12-14 but this morning chest pressure and tightness is worse and it's hard to take a deep breath. Patient reports she has passed out 6 times since 12/14/2024.

## 2024-02-11 NOTE — Discharge Instructions (Signed)
 You were seen today for palpitation/syncope. While you were here we monitored your vitals, preformed a physical exam, and labs and imaging. These were all reassuring and there is no indication for any further testing or intervention in the emergency department at this time.   Things to do:  - Follow up with your primary care provider within the next 1-2 weeks - Cardiology will be sending you a Holter monitor to your house to monitor your heart rhythm, they also plan to follow-up with you in the clinic and we have sent referral.  Please call them back at [phone] if you not hear from them in the next week or 2  Return to the emergency department if you have any new or worsening symptoms including recurrent syncope, chest pain, worsening shortness of breath, continued feelings of palpitations, or if you have any other concerns.

## 2024-02-12 ENCOUNTER — Ambulatory Visit

## 2024-02-12 DIAGNOSIS — R55 Syncope and collapse: Secondary | ICD-10-CM

## 2024-02-12 NOTE — Progress Notes (Unsigned)
 Enrolled for [employer] to mail a ZIO XT long term holter monitor to the patients address on file.   Dr. Royann Shivers to read.

## 2024-03-09 ENCOUNTER — Ambulatory Visit: Payer: Self-pay | Admitting: Cardiovascular Disease

## 2024-03-09 DIAGNOSIS — R55 Syncope and collapse: Secondary | ICD-10-CM | POA: Diagnosis not present

## 2024-03-31 ENCOUNTER — Ambulatory Visit: Admitting: Cardiovascular Disease

## 2024-05-27 ENCOUNTER — Ambulatory Visit: Payer: Self-pay
